# Patient Record
Sex: Female | Born: 1965 | Race: White | Hispanic: No | Marital: Married | Smoking: Former smoker
Health system: Southern US, Community
[De-identification: ages and names within clinical notes are randomized; demographics above are authoritative.]

## PROBLEM LIST (undated history)

## (undated) DIAGNOSIS — I10 Essential (primary) hypertension: Secondary | ICD-10-CM

## (undated) DIAGNOSIS — N871 Moderate cervical dysplasia: Secondary | ICD-10-CM

## (undated) HISTORY — DX: Moderate cervical dysplasia: N87.1

## (undated) HISTORY — PX: MOUTH SURGERY: SHX715

## (undated) HISTORY — DX: Essential (primary) hypertension: I10

---

## 1997-12-22 ENCOUNTER — Other Ambulatory Visit: Admission: RE | Admit: 1997-12-22 | Discharge: 1997-12-22 | Payer: Self-pay | Admitting: Obstetrics and Gynecology

## 1999-01-08 ENCOUNTER — Other Ambulatory Visit: Admission: RE | Admit: 1999-01-08 | Discharge: 1999-01-08 | Payer: Self-pay | Admitting: Obstetrics and Gynecology

## 1999-10-22 ENCOUNTER — Encounter: Payer: Self-pay | Admitting: Obstetrics and Gynecology

## 1999-10-22 ENCOUNTER — Inpatient Hospital Stay (HOSPITAL_COMMUNITY): Admission: AD | Admit: 1999-10-22 | Discharge: 1999-10-22 | Payer: Self-pay | Admitting: Obstetrics and Gynecology

## 1999-10-24 ENCOUNTER — Inpatient Hospital Stay (HOSPITAL_COMMUNITY): Admission: AD | Admit: 1999-10-24 | Discharge: 1999-10-26 | Payer: Self-pay | Admitting: Obstetrics and Gynecology

## 1999-10-28 ENCOUNTER — Encounter: Admission: RE | Admit: 1999-10-28 | Discharge: 2000-01-26 | Payer: Self-pay | Admitting: Obstetrics and Gynecology

## 1999-11-19 ENCOUNTER — Other Ambulatory Visit: Admission: RE | Admit: 1999-11-19 | Discharge: 1999-11-19 | Payer: Self-pay | Admitting: Obstetrics and Gynecology

## 2001-05-14 ENCOUNTER — Other Ambulatory Visit: Admission: RE | Admit: 2001-05-14 | Discharge: 2001-05-14 | Payer: Self-pay | Admitting: Obstetrics and Gynecology

## 2002-06-17 ENCOUNTER — Other Ambulatory Visit: Admission: RE | Admit: 2002-06-17 | Discharge: 2002-06-17 | Payer: Self-pay | Admitting: Obstetrics and Gynecology

## 2003-04-29 ENCOUNTER — Inpatient Hospital Stay (HOSPITAL_COMMUNITY): Admission: AD | Admit: 2003-04-29 | Discharge: 2003-05-01 | Payer: Self-pay | Admitting: Obstetrics and Gynecology

## 2003-06-10 ENCOUNTER — Other Ambulatory Visit: Admission: RE | Admit: 2003-06-10 | Discharge: 2003-06-10 | Payer: Self-pay | Admitting: Obstetrics and Gynecology

## 2004-06-28 ENCOUNTER — Other Ambulatory Visit: Admission: RE | Admit: 2004-06-28 | Discharge: 2004-06-28 | Payer: Self-pay | Admitting: Obstetrics and Gynecology

## 2004-10-09 ENCOUNTER — Encounter: Admission: RE | Admit: 2004-10-09 | Discharge: 2004-10-09 | Payer: Self-pay | Admitting: Obstetrics and Gynecology

## 2016-09-24 DIAGNOSIS — R8299 Other abnormal findings in urine: Secondary | ICD-10-CM | POA: Diagnosis not present

## 2016-09-24 DIAGNOSIS — I10 Essential (primary) hypertension: Secondary | ICD-10-CM | POA: Diagnosis not present

## 2016-09-24 DIAGNOSIS — Z0000 Encounter for general adult medical examination without abnormal findings: Secondary | ICD-10-CM | POA: Diagnosis not present

## 2016-09-24 DIAGNOSIS — R7301 Impaired fasting glucose: Secondary | ICD-10-CM | POA: Diagnosis not present

## 2016-09-27 DIAGNOSIS — I10 Essential (primary) hypertension: Secondary | ICD-10-CM | POA: Diagnosis not present

## 2016-09-27 DIAGNOSIS — Z0000 Encounter for general adult medical examination without abnormal findings: Secondary | ICD-10-CM | POA: Diagnosis not present

## 2016-09-27 DIAGNOSIS — E668 Other obesity: Secondary | ICD-10-CM | POA: Diagnosis not present

## 2016-09-27 DIAGNOSIS — Z1389 Encounter for screening for other disorder: Secondary | ICD-10-CM | POA: Diagnosis not present

## 2016-09-27 DIAGNOSIS — K760 Fatty (change of) liver, not elsewhere classified: Secondary | ICD-10-CM | POA: Diagnosis not present

## 2016-09-27 DIAGNOSIS — R7301 Impaired fasting glucose: Secondary | ICD-10-CM | POA: Diagnosis not present

## 2016-10-14 DIAGNOSIS — H6123 Impacted cerumen, bilateral: Secondary | ICD-10-CM | POA: Diagnosis not present

## 2017-09-25 DIAGNOSIS — R7301 Impaired fasting glucose: Secondary | ICD-10-CM | POA: Diagnosis not present

## 2017-09-25 DIAGNOSIS — I10 Essential (primary) hypertension: Secondary | ICD-10-CM | POA: Diagnosis not present

## 2017-09-25 DIAGNOSIS — R82998 Other abnormal findings in urine: Secondary | ICD-10-CM | POA: Diagnosis not present

## 2017-10-03 DIAGNOSIS — I10 Essential (primary) hypertension: Secondary | ICD-10-CM | POA: Diagnosis not present

## 2017-10-03 DIAGNOSIS — Z1389 Encounter for screening for other disorder: Secondary | ICD-10-CM | POA: Diagnosis not present

## 2017-10-03 DIAGNOSIS — R7301 Impaired fasting glucose: Secondary | ICD-10-CM | POA: Diagnosis not present

## 2017-10-03 DIAGNOSIS — K760 Fatty (change of) liver, not elsewhere classified: Secondary | ICD-10-CM | POA: Diagnosis not present

## 2017-10-03 DIAGNOSIS — Z0000 Encounter for general adult medical examination without abnormal findings: Secondary | ICD-10-CM | POA: Diagnosis not present

## 2017-10-03 DIAGNOSIS — E668 Other obesity: Secondary | ICD-10-CM | POA: Diagnosis not present

## 2017-10-10 DIAGNOSIS — L918 Other hypertrophic disorders of the skin: Secondary | ICD-10-CM | POA: Diagnosis not present

## 2017-10-10 DIAGNOSIS — D2261 Melanocytic nevi of right upper limb, including shoulder: Secondary | ICD-10-CM | POA: Diagnosis not present

## 2017-10-10 DIAGNOSIS — D2262 Melanocytic nevi of left upper limb, including shoulder: Secondary | ICD-10-CM | POA: Diagnosis not present

## 2017-10-10 DIAGNOSIS — D224 Melanocytic nevi of scalp and neck: Secondary | ICD-10-CM | POA: Diagnosis not present

## 2017-10-20 ENCOUNTER — Encounter: Payer: Self-pay | Admitting: Obstetrics and Gynecology

## 2017-10-20 ENCOUNTER — Other Ambulatory Visit (HOSPITAL_COMMUNITY)
Admission: RE | Admit: 2017-10-20 | Discharge: 2017-10-20 | Disposition: A | Discharge status: Home / Self Care | Payer: BLUE CROSS/BLUE SHIELD | Source: Ambulatory Visit | Attending: Obstetrics and Gynecology | Admitting: Obstetrics and Gynecology

## 2017-10-20 ENCOUNTER — Other Ambulatory Visit: Payer: Self-pay

## 2017-10-20 ENCOUNTER — Ambulatory Visit: Payer: BLUE CROSS/BLUE SHIELD | Admitting: Obstetrics and Gynecology

## 2017-10-20 VITALS — BP 128/80 | HR 80 | Resp 16 | Ht 64.0 in | Wt 194.0 lb

## 2017-10-20 DIAGNOSIS — Z01419 Encounter for gynecological examination (general) (routine) without abnormal findings: Secondary | ICD-10-CM

## 2017-10-20 DIAGNOSIS — Z1211 Encounter for screening for malignant neoplasm of colon: Secondary | ICD-10-CM

## 2017-10-20 DIAGNOSIS — N951 Menopausal and female climacteric states: Secondary | ICD-10-CM

## 2017-10-20 DIAGNOSIS — Z3009 Encounter for other general counseling and advice on contraception: Secondary | ICD-10-CM | POA: Diagnosis not present

## 2017-10-20 LAB — POCT URINE PREGNANCY: Preg Test, Ur: NEGATIVE

## 2017-10-20 NOTE — Progress Notes (Signed)
52 y.o. G45P0002 [name] here as a new patient for annual exam.  Patient has seen [facility] in the past. Referred from Mother who is a patient here. Per patient IUD due to come out.  Would like a new Mirena IUD.  Unsure if she has hot flashes.  Would like a new one.  Has painful experience in the past with the IUD exchange.  Works as Personnel officer.  Labs with PCP.  Did not complete her stool card kit with PCP.   PCP: Dr. Lang Snow     No LMP recorded. Patient is not currently having periods (Reason: IUD).    Not sure when it was due for removal.        Sexually active: Yes.    The current method of family planning is IUD -- Mirena IUD per patient due to come out.    Exercising: Yes.    walking Smoker:  former  Health Maintenance: Pap:  About 2 years ago per patient done at [facility] -- normal per patient History of abnormal Pap:  no MMG:  About 2 years ago at [facility] per patient normal. Colonoscopy:  never BMD:   N/a  Result  n/a TDaP:  Up to date within the last 10 years Gardasil:   no HIV: negative with pregnancy Hep C: never Screening Labs:  PCP   reports that she has quit smoking. Her smoking use included cigarettes. she has never used smokeless tobacco. She reports that she drinks alcohol. She reports that she does not use drugs.  Past Medical History:  Diagnosis Date  . Hypertension     History reviewed. No pertinent surgical history.  Current Outpatient Medications  Medication Sig Dispense Refill  . amLODipine (NORVASC) 5 MG tablet Take 5 mg by mouth daily.     No current facility-administered medications for this visit.     Family History  Problem Relation Age of Onset  . Hypertension Mother   . Hypertension Father   . Breast cancer Maternal Grandmother   . Lung cancer Maternal Grandfather   . Non-Hodgkin's lymphoma Paternal Grandmother   . Cancer Paternal Grandfather     ROS:  Pertinent  items are noted in HPI.  Otherwise, a comprehensive ROS was negative.  Exam:   BP 128/80 (BP Location: Right Arm, Patient Position: Sitting, Cuff Size: Large)   Pulse 80   Resp 16   Ht 5' 4"  (1.626 m)   Wt 194 lb (88 kg)   BMI 33.30 kg/m     General appearance: alert, cooperative and appears stated age Head: Normocephalic, without obvious abnormality, atraumatic Neck: no adenopathy, supple, symmetrical, trachea midline and thyroid normal to inspection and palpation Lungs: clear to auscultation bilaterally Breasts: normal appearance, no masses or tenderness, No nipple retraction or dimpling, No nipple discharge or bleeding, No axillary or supraclavicular adenopathy Heart: regular rate and rhythm Abdomen: soft, non-tender; no masses, no organomegaly Extremities: extremities normal, atraumatic, no cyanosis or edema Skin: Skin color, texture, turgor normal. No rashes or lesions Lymph nodes: Cervical, supraclavicular, and axillary nodes normal. No abnormal inguinal nodes palpated Neurologic: Grossly normal  Pelvic: External genitalia:  no lesions              Urethra:  normal appearing urethra with no masses, tenderness or lesions              Bartholins and Skenes: normal  Vagina: normal appearing vagina with normal color and discharge, no lesions              Cervix: no lesions.  IUD strings seen              Pap taken: Yes.   Bimanual Exam:  Uterus:  normal size, contour, position, consistency, mobility, non-tender              Adnexa: no mass, fullness, tenderness              Rectal exam: Yes.  .  Confirms.              Anus:  normal sphincter tone, no lesions  Chaperone was present for exam.  Assessment:   Well woman visit with normal exam. IUD expired.  Menopausal symptoms.  HTN.   Plan:  Mammogram screening discussed.  She will schedule at [facility]. Recommended self breast awareness. Pap and HR HPV as above. Guidelines for Calcium, Vitamin D,  regular exercise program including cardiovascular and weight bearing exercise. UPT.  Check FSH and estradiol. Routine labs with PCP. IFOB here. Condoms recommended for pregnancy prevention.  Follow up annually and prn.    After visit summary provided.

## 2017-10-20 NOTE — Addendum Note (Signed)
Addended by: Yisroel Ramming, Dietrich Pates E on: 10/20/2017 04:49 PM   Modules accepted: Orders

## 2017-10-20 NOTE — Addendum Note (Signed)
Addended by: Terence Lux A on: 10/20/2017 09:30 AM   Modules accepted: Orders

## 2017-10-20 NOTE — Patient Instructions (Signed)

## 2017-10-21 ENCOUNTER — Other Ambulatory Visit: Payer: Self-pay | Admitting: Obstetrics and Gynecology

## 2017-10-21 ENCOUNTER — Telehealth: Payer: Self-pay | Admitting: *Deleted

## 2017-10-21 DIAGNOSIS — Z3009 Encounter for other general counseling and advice on contraception: Secondary | ICD-10-CM

## 2017-10-21 LAB — ESTRADIOL: Estradiol: 130.1 pg/mL

## 2017-10-21 LAB — FOLLICLE STIMULATING HORMONE: FSH: 4.5 m[IU]/mL

## 2017-10-21 NOTE — Telephone Encounter (Signed)
Notes recorded by Burnice Logan, RN on 10/21/2017 at 2:49 PM EST Left message to call [name] at [phone].

## 2017-10-21 NOTE — Telephone Encounter (Signed)
-----   Message from Nunzio Cobbs, MD sent at 10/21/2017  2:37 PM EST ----- Please inform patient of labs which show she is not in menopause.  I will place an order for a new Mirena IUD.  She needs to use condoms for pregnancy prevention right now because her IUD is expired.   She had a painful IUD exchange in the past, and I told her I could make this more comfortable by doing a paracervical block.  The Cytotec is probably not going to give a lot of benefit, so I would skip this.

## 2017-10-21 NOTE — Telephone Encounter (Addendum)
Spoke with patient, advised as seen below per Dr. Quincy Simmonds. IUD exchange scheduled for 11/05/17 at 3:30pm. Has not been SA since last OV, aware to use condoms for pregnancy prevention. Patient is aware will be called with benefits, verbalizes understanding and is agreeable.    Routing to [employer] for benefits review.  Will close encounter.  Cc: Dr. Quincy Simmonds

## 2017-10-22 LAB — CYTOLOGY - PAP
DIAGNOSIS: NEGATIVE
HPV: NOT DETECTED

## 2017-11-05 ENCOUNTER — Other Ambulatory Visit: Payer: Self-pay

## 2017-11-05 ENCOUNTER — Encounter: Payer: Self-pay | Admitting: Obstetrics and Gynecology

## 2017-11-05 ENCOUNTER — Ambulatory Visit (INDEPENDENT_AMBULATORY_CARE_PROVIDER_SITE_OTHER): Payer: BLUE CROSS/BLUE SHIELD | Admitting: Obstetrics and Gynecology

## 2017-11-05 VITALS — BP 148/80 | HR 88 | Resp 16 | Wt 191.0 lb

## 2017-11-05 DIAGNOSIS — Z30433 Encounter for removal and reinsertion of intrauterine contraceptive device: Secondary | ICD-10-CM

## 2017-11-05 DIAGNOSIS — Z3009 Encounter for other general counseling and advice on contraception: Secondary | ICD-10-CM

## 2017-11-05 LAB — POCT URINE PREGNANCY: PREG TEST UR: NEGATIVE

## 2017-11-05 NOTE — Progress Notes (Signed)
GYNECOLOGY  VISIT   HPI: 52 y.o.   Married  Caucasian  female   G2P0002 with No LMP recorded (lmp unknown). Patient is not currently having periods (Reason: IUD).   here for Mirena IUD exchange  UPT: negative.  Last intercourse was 3 days ago.  Used a condom.  No unprotected intercourse in the last 2 weeks.   [place] 4.5 on 10/20/17.  GYNECOLOGIC HISTORY: No LMP recorded (lmp unknown). Patient is not currently having periods (Reason: IUD). Contraception:  IUD Mirena inserted about 2013  Menopausal hormone therapy:  none Last mammogram:  About 2 years ago at [facility] per patient normal Last pap smear:   10/20/17 Pap and HR HPV negative        OB History    Gravida Para Term Preterm AB Living   2 2 0 0 0 2   SAB TAB Ectopic Multiple Live Births   0 0 0 0 0         There are no active problems to display for this patient.   Past Medical History:  Diagnosis Date  . Hypertension     History reviewed. No pertinent surgical history.  Current Outpatient Medications  Medication Sig Dispense Refill  . amLODipine (NORVASC) 5 MG tablet Take 5 mg by mouth daily.     No current facility-administered medications for this visit.      ALLERGIES: Patient has no known allergies.  Family History  Problem Relation Age of Onset  . Hypertension Mother   . Hypertension Father   . Breast cancer Maternal Grandmother   . Lung cancer Maternal Grandfather   . Non-Hodgkin's lymphoma Paternal Grandmother   . Cancer Paternal Grandfather     Social History   Socioeconomic History  . Marital status: Married    Spouse name: Not on file  . Number of children: Not on file  . Years of education: Not on file  . Highest education level: Not on file  Social Needs  . Financial resource strain: Not on file  . Food insecurity - worry: Not on file  . Food insecurity - inability: Not on file  . Transportation needs - medical: Not on file  . Transportation needs - non-medical: Not on file   Occupational History  . Not on file  Tobacco Use  . Smoking status: Former Smoker    Types: Cigarettes  . Smokeless tobacco: Never Used  Substance and Sexual Activity  . Alcohol use: Yes    Comment: occasional  . Drug use: No  . Sexual activity: Yes    Birth control/protection: IUD    Comment: Mirena IUD  Other Topics Concern  . Not on file  Social History Narrative  . Not on file    ROS:  Pertinent items are noted in HPI.  PHYSICAL EXAMINATION:    BP (!) 148/80 (BP Location: Right Arm, Patient Position: Sitting, Cuff Size: Large)   Pulse 88   Resp 16   Wt 191 lb (86.6 kg)   LMP  (LMP Unknown)   BMI 32.79 kg/m     General appearance: alert, cooperative and appears stated age  Pelvic: External genitalia:  no lesions              Urethra:  normal appearing urethra with no masses, tenderness or lesions              Bartholins and Skenes: normal  Vagina: normal appearing vagina with normal color and discharge, no lesions              Cervix: no lesions.  IUD strings noted.                Bimanual Exam:  Uterus:  normal size, contour, position, consistency, mobility, non-tender              Adnexa: no mass, fullness, tenderness                IUD removal and reinsertion of Mirena IUD.  Lot number [id number], exp Aug 2021. Consent for procedures.  IUD removed with ring forceps, intact, and discarded (Patient declined to see.) Hibiclens prep.  Paracervical block with 10 cc 1% lidocaine, lot number [id number], exp 11/21. Uterine sounded to almost 6.5 cm.  IUD placed without difficulty.  Strings trimmed.  IUD card given to patient.  Minimal EBL. No complications.   Chaperone was present for exam.  ASSESSMENT  Mirena IUD removal and reinserting of new Mirena IUD.   PLAN  Instructions and precautions given.  Back up protection for 1 week.  Follow up in 1 month and prn.   An After Visit Summary was printed and given to the patient.

## 2017-11-05 NOTE — Patient Instructions (Signed)

## 2017-11-17 LAB — FECAL OCCULT BLOOD, IMMUNOCHEMICAL: IFOBT: NEGATIVE

## 2017-12-01 ENCOUNTER — Ambulatory Visit: Payer: BLUE CROSS/BLUE SHIELD | Admitting: Obstetrics and Gynecology

## 2017-12-01 ENCOUNTER — Other Ambulatory Visit: Payer: Self-pay

## 2017-12-01 ENCOUNTER — Encounter: Payer: Self-pay | Admitting: Obstetrics and Gynecology

## 2017-12-01 VITALS — BP 140/82 | HR 84 | Resp 16 | Ht 64.0 in | Wt 195.0 lb

## 2017-12-01 DIAGNOSIS — Z30431 Encounter for routine checking of intrauterine contraceptive device: Secondary | ICD-10-CM

## 2017-12-01 NOTE — Progress Notes (Signed)
GYNECOLOGY  VISIT   HPI: 52 y.o.   Married  Caucasian  female   G2P0002 with No LMP recorded (lmp unknown). (Menstrual status: IUD).   here for IUD recheck.  No bleeding other than the day of the insertion.  No dyspareunia. Has not checked the strings.   GYNECOLOGIC HISTORY: No LMP recorded (lmp unknown). (Menstrual status: IUD). Contraception:  11/05/17 Mirena IUD inserted Menopausal hormone therapy:  none Last mammogram:  About 2 years ago at [facility] per patient normal Last pap smear:   10/20/17 Pap and HR HPV negative        OB History    Gravida  2   Para  2   Term  0   Preterm  0   AB  0   Living  2     SAB  0   TAB  0   Ectopic  0   Multiple  0   Live Births  0              There are no active problems to display for this patient.   Past Medical History:  Diagnosis Date  . Hypertension     No past surgical history on file.  Current Outpatient Medications  Medication Sig Dispense Refill  . amLODipine (NORVASC) 5 MG tablet Take 5 mg by mouth daily.     No current facility-administered medications for this visit.      ALLERGIES: Patient has no known allergies.  Family History  Problem Relation Age of Onset  . Hypertension Mother   . Hypertension Father   . Breast cancer Maternal Grandmother   . Lung cancer Maternal Grandfather   . Non-Hodgkin's lymphoma Paternal Grandmother   . Cancer Paternal Grandfather     Social History   Socioeconomic History  . Marital status: Married    Spouse name: Not on file  . Number of children: Not on file  . Years of education: Not on file  . Highest education level: Not on file  Occupational History  . Not on file  Social Needs  . Financial resource strain: Not on file  . Food insecurity:    Worry: Not on file    Inability: Not on file  . Transportation needs:    Medical: Not on file    Non-medical: Not on file  Tobacco Use  . Smoking status: Former Smoker    Types: Cigarettes   . Smokeless tobacco: Never Used  Substance and Sexual Activity  . Alcohol use: Yes    Comment: occasional  . Drug use: No  . Sexual activity: Yes    Birth control/protection: IUD    Comment: Mirena IUD  Lifestyle  . Physical activity:    Days per week: Not on file    Minutes per session: Not on file  . Stress: Not on file  Relationships  . Social connections:    Talks on phone: Not on file    Gets together: Not on file    Attends religious service: Not on file    Active member of club or organization: Not on file    Attends meetings of clubs or organizations: Not on file    Relationship status: Not on file  . Intimate partner violence:    Fear of current or ex partner: Not on file    Emotionally abused: Not on file    Physically abused: Not on file    Forced sexual activity: Not on file  Other Topics Concern  .  Not on file  Social History Narrative  . Not on file    ROS:  Pertinent items are noted in HPI.  PHYSICAL EXAMINATION:    BP 140/82 (BP Location: Right Arm, Patient Position: Sitting, Cuff Size: Large)   Pulse 84   Resp 16   Ht 5\' 4"  (1.626 m)   Wt 195 lb (88.5 kg)   LMP  (LMP Unknown)   BMI 33.47 kg/m     General appearance: alert, cooperative and appears stated age   Pelvic: External genitalia:  no lesions              Urethra:  normal appearing urethra with no masses, tenderness or lesions              Bartholins and Skenes: normal                 Vagina: normal appearing vagina with normal color and discharge, no lesions              Cervix: no lesions.  IUD strings noted.                 Bimanual Exam:  Uterus:  normal size, contour, position, consistency, mobility, non-tender              Adnexa: no mass, fullness, tenderness        Chaperone was present for exam.  ASSESSMENT  Mirena IUD check up.  Doing well.   PLAN  IUD working well for contraception.  Fu for annual exam and prn.   An After Visit Summary was printed and given to the  patient.  __10____ minutes face to face time of which over 50% was spent in counseling.

## 2018-02-05 DIAGNOSIS — I10 Essential (primary) hypertension: Secondary | ICD-10-CM | POA: Diagnosis not present

## 2018-02-05 DIAGNOSIS — Z6833 Body mass index (BMI) 33.0-33.9, adult: Secondary | ICD-10-CM | POA: Diagnosis not present

## 2018-03-30 ENCOUNTER — Other Ambulatory Visit: Payer: Self-pay | Admitting: Obstetrics and Gynecology

## 2018-03-30 DIAGNOSIS — Z1231 Encounter for screening mammogram for malignant neoplasm of breast: Secondary | ICD-10-CM

## 2018-04-29 ENCOUNTER — Ambulatory Visit
Admission: RE | Admit: 2018-04-29 | Discharge: 2018-04-29 | Disposition: A | Discharge status: Home / Self Care | Payer: BLUE CROSS/BLUE SHIELD | Source: Ambulatory Visit | Attending: Obstetrics and Gynecology | Admitting: Obstetrics and Gynecology

## 2018-04-29 DIAGNOSIS — Z1231 Encounter for screening mammogram for malignant neoplasm of breast: Secondary | ICD-10-CM | POA: Diagnosis not present

## 2018-10-06 DIAGNOSIS — I10 Essential (primary) hypertension: Secondary | ICD-10-CM | POA: Diagnosis not present

## 2018-10-06 DIAGNOSIS — N390 Urinary tract infection, site not specified: Secondary | ICD-10-CM | POA: Diagnosis not present

## 2018-10-06 DIAGNOSIS — R7301 Impaired fasting glucose: Secondary | ICD-10-CM | POA: Diagnosis not present

## 2018-10-13 DIAGNOSIS — Z1212 Encounter for screening for malignant neoplasm of rectum: Secondary | ICD-10-CM | POA: Diagnosis not present

## 2018-10-13 LAB — IFOBT (OCCULT BLOOD): IFOBT: NEGATIVE

## 2018-10-14 DIAGNOSIS — R740 Nonspecific elevation of levels of transaminase and lactic acid dehydrogenase [LDH]: Secondary | ICD-10-CM | POA: Diagnosis not present

## 2018-10-14 DIAGNOSIS — Z0000 Encounter for general adult medical examination without abnormal findings: Secondary | ICD-10-CM | POA: Diagnosis not present

## 2018-10-14 DIAGNOSIS — Z1331 Encounter for screening for depression: Secondary | ICD-10-CM | POA: Diagnosis not present

## 2018-10-14 DIAGNOSIS — K760 Fatty (change of) liver, not elsewhere classified: Secondary | ICD-10-CM | POA: Diagnosis not present

## 2018-10-14 DIAGNOSIS — I10 Essential (primary) hypertension: Secondary | ICD-10-CM | POA: Diagnosis not present

## 2018-10-14 DIAGNOSIS — R7301 Impaired fasting glucose: Secondary | ICD-10-CM | POA: Diagnosis not present

## 2018-11-03 ENCOUNTER — Telehealth: Payer: Self-pay | Admitting: Obstetrics and Gynecology

## 2018-11-03 NOTE — Telephone Encounter (Signed)
Left patient a message to call back to reschedule a future appointment that was cancelled by the provider for her AEX. °

## 2018-11-23 ENCOUNTER — Ambulatory Visit: Payer: BLUE CROSS/BLUE SHIELD | Admitting: Obstetrics and Gynecology

## 2019-01-29 ENCOUNTER — Encounter: Payer: Self-pay | Admitting: Gastroenterology

## 2019-02-22 ENCOUNTER — Other Ambulatory Visit: Payer: Self-pay

## 2019-02-22 ENCOUNTER — Ambulatory Visit: Payer: BC Managed Care – PPO | Admitting: *Deleted

## 2019-02-22 VITALS — Ht 65.0 in | Wt 184.0 lb

## 2019-02-22 DIAGNOSIS — Z1211 Encounter for screening for malignant neoplasm of colon: Secondary | ICD-10-CM

## 2019-02-22 MED ORDER — SUPREP BOWEL PREP KIT 17.5-3.13-1.6 GM/177ML PO SOLN: 1.0000 | Freq: Once | ORAL | 0 refills | Status: AC

## 2019-02-22 NOTE — Progress Notes (Signed)
No egg or soy allergy known to patient  No issues with past sedation with any surgeries  or procedures, no intubation problems  No diet pills per patient No home 02 use per patient  No blood thinners per patient  Pt denies issues with constipation  No A fib or A flutter  EMMI video sent to pt's e mail   Pt verified name, DOB, address and insurance during PV today. Pt mailed instruction packet to included paper to complete and mail back to [facility] with addressed and stamped envelope, Emmi video, copy of consent form to read and not return, and instructions. Suprep $50  coupon mailed in packet. PV completed over the phone. Pt encouraged to call with questions or issues   Pt is aware that care partner will wait in the car during proceudre; if they feel like they will be too hot to wait in the car; they may wait in the lobby.  We want them to wear a mask (we do not have any that we can provide them), practice social distancing, and we will check their temperatures when they get here.  I did remind patient that their care partner needs to stay in the parking lot the entire time. Pt will wear mask into building.

## 2019-02-24 ENCOUNTER — Encounter: Payer: Self-pay | Admitting: Gastroenterology

## 2019-03-05 ENCOUNTER — Telehealth: Payer: Self-pay | Admitting: Gastroenterology

## 2019-03-05 NOTE — Telephone Encounter (Signed)
Patient called back and answered no to all questions.

## 2019-03-05 NOTE — Telephone Encounter (Signed)

## 2019-03-08 ENCOUNTER — Encounter: Payer: Self-pay | Admitting: Gastroenterology

## 2019-03-08 ENCOUNTER — Ambulatory Visit (AMBULATORY_SURGERY_CENTER): Payer: BC Managed Care – PPO | Admitting: Gastroenterology

## 2019-03-08 VITALS — BP 113/59 | HR 72 | Temp 98.9°F | Resp 15 | Ht 64.0 in | Wt 195.0 lb

## 2019-03-08 DIAGNOSIS — Z1211 Encounter for screening for malignant neoplasm of colon: Secondary | ICD-10-CM | POA: Diagnosis not present

## 2019-03-08 DIAGNOSIS — D123 Benign neoplasm of transverse colon: Secondary | ICD-10-CM

## 2019-03-08 DIAGNOSIS — K635 Polyp of colon: Secondary | ICD-10-CM | POA: Diagnosis not present

## 2019-03-08 DIAGNOSIS — D122 Benign neoplasm of ascending colon: Secondary | ICD-10-CM | POA: Diagnosis not present

## 2019-03-08 HISTORY — PX: COLONOSCOPY: SHX174

## 2019-03-08 MED ORDER — SODIUM CHLORIDE 0.9 % IV SOLN: 500.0000 mL | Freq: Once | INTRAVENOUS | Status: DC

## 2019-03-08 NOTE — Progress Notes (Signed)
To PACU, VSS. Report to [email or web address] 

## 2019-03-08 NOTE — Op Note (Signed)
[facility] Patient Name: Lacey Baker Procedure Date: 03/08/2019 7:33 AM MRN: [id number] Endoscopist: Thornton Park MD, MD Age: 53 Referring MD:  Date of Birth: Jun 14, 1966 Gender: Female Account #: [id number] Procedure:                Colonoscopy Indications:              Screening for colorectal malignant neoplasm, This                            is the patient's first colonoscopy                           No known family history of colon cancer or polyps                           No baseline GI symptoms Medicines:                See the Anesthesia note for documentation of the                            administered medications Procedure:                Pre-Anesthesia Assessment:                           - Prior to the procedure, a History and Physical                            was performed, and patient medications and                            allergies were reviewed. The patient's tolerance of                            previous anesthesia was also reviewed. The risks                            and benefits of the procedure and the sedation                            options and risks were discussed with the patient.                            All questions were answered, and informed consent                            was obtained. Prior Anticoagulants: The patient has                            taken no previous anticoagulant or antiplatelet                            agents. ASA Grade Assessment: II - A patient with  mild systemic disease. After reviewing the risks                            and benefits, the patient was deemed in                            satisfactory condition to undergo the procedure.                           After obtaining informed consent, the colonoscope                            was passed under direct vision. Throughout the                            procedure, the patient's blood pressure, pulse, and                            oxygen saturations were monitored continuously. The                            Colonoscope was introduced through the anus and                            advanced to the the terminal ileum, with                            identification of the appendiceal orifice and IC                            valve. The colonoscopy was performed with moderate                            difficulty due to restricted mobility of the colon,                            a redundant colon and a tortuous colon. Successful                            completion of the procedure was aided by applying                            abdominal pressure. The patient tolerated the                            procedure well. The quality of the bowel                            preparation was excellent. The terminal ileum,                            ileocecal valve, appendiceal orifice, and rectum  were photographed. Scope In: 7:39:17 AM Scope Out: 7:58:05 AM Scope Withdrawal Time: 0 hours 14 minutes 18 seconds  Total Procedure Duration: 0 hours 18 minutes 48 seconds  Findings:                 Hemorrhoids were found on perianal exam.                           An 55mm mm polyp was found in the ascending colon.                            The polyp was sessile. The polyp was removed with a                            cold snare. Resection and retrieval were complete.                            Estimated blood loss was minimal.                           A 10 mm polyp was found in the distal transverse                            colon. The polyp was sessile. The polyp was removed                            with a cold snare. Resection and retrieval were                            complete. Estimated blood loss was minimal.                           The exam was otherwise without abnormality on                            direct and retroflexion views. Complications:            No  immediate complications. Estimated blood loss:                            Minimal. Estimated Blood Loss:     Estimated blood loss was minimal. Impression:               - Hemorrhoids found on perianal exam.                           - One 8 mm polyp in the ascending colon, removed                            with a cold snare. Resected and retrieved.                           - One 10 mm polyp in the distal transverse colon,  removed with a cold snare. Resected and retrieved.                           - The examination was otherwise normal on direct                            and retroflexion views. Recommendation:           - Patient has a contact number available for                            emergencies. The signs and symptoms of potential                            delayed complications were discussed with the                            patient. Return to normal activities tomorrow.                            Written discharge instructions were provided to the                            patient.                           - Resume regular diet today.                           - Continue present medications.                           - Await pathology results.                           - Repeat colonoscopy date to be determined after                            pending pathology results are reviewed for                            surveillance based on pathology results and the                            size of the polyps. Thornton Park MD, MD 03/08/2019 8:04:33 AM This report has been signed electronically.

## 2019-03-08 NOTE — Progress Notes (Signed)
Pt's states no medical or surgical changes since previsit or office visit. MB temp JB vitals

## 2019-03-08 NOTE — Patient Instructions (Signed)
   Information on polyps and hemorrhoids given to you today  Await pathology results on polyps removed    YOU HAD AN ENDOSCOPIC PROCEDURE TODAY AT [facility]:   Refer to the procedure report that was given to you for any specific questions about what was found during the examination.  If the procedure report does not answer your questions, please call your gastroenterologist to clarify.  If you requested that your care partner not be given the details of your procedure findings, then the procedure report has been included in a sealed envelope for you to review at your convenience later.  YOU SHOULD EXPECT: Some feelings of bloating in the abdomen. Passage of more gas than usual.  Walking can help get rid of the air that was put into your GI tract during the procedure and reduce the bloating. If you had a lower endoscopy (such as a colonoscopy or flexible sigmoidoscopy) you may notice spotting of blood in your stool or on the toilet paper. If you underwent a bowel prep for your procedure, you may not have a normal bowel movement for a few days.  Please Note:  You might notice some irritation and congestion in your nose or some drainage.  This is from the oxygen used during your procedure.  There is no need for concern and it should clear up in a day or so.  SYMPTOMS TO REPORT IMMEDIATELY:   Following lower endoscopy (colonoscopy or flexible sigmoidoscopy):  Excessive amounts of blood in the stool  Significant tenderness or worsening of abdominal pains  Swelling of the abdomen that is new, acute  Fever of 100F or higher    For urgent or emergent issues, a gastroenterologist can be reached at any hour by calling [phone].   DIET:  We do recommend a small meal at first, but then you may proceed to your regular diet.  Drink plenty of fluids but you should avoid alcoholic beverages for 24 hours.  ACTIVITY:  You should plan to take it easy for the rest of today and you  should NOT DRIVE or use heavy machinery until tomorrow (because of the sedation medicines used during the test).    FOLLOW UP: Our staff will call the number listed on your records 48-72 hours following your procedure to check on you and address any questions or concerns that you may have regarding the information given to you following your procedure. If we do not reach you, we will leave a message.  We will attempt to reach you two times.  During this call, we will ask if you have developed any symptoms of COVID 19. If you develop any symptoms (ie: fever, flu-like symptoms, shortness of breath, cough etc.) before then, please call [phone].  If you test positive for Covid 19 in the 2 weeks post procedure, please call and report this information to [place].    If any biopsies were taken you will be contacted by phone or by letter within the next 1-3 weeks.  Please call us at [phone] if you have not heard about the biopsies in 3 weeks.    SIGNATURES/CONFIDENTIALITY: You and/or your care partner have signed paperwork which will be entered into your electronic medical record.  These signatures attest to the fact that that the information above on your After Visit Summary has been reviewed and is understood.  Full responsibility of the confidentiality of this discharge information lies with you and/or your care-partner.

## 2019-03-08 NOTE — Progress Notes (Signed)
Called to room to assist during endoscopic procedure.  Patient ID and intended procedure confirmed with present staff. Received instructions for my participation in the procedure from the performing physician.  

## 2019-03-10 ENCOUNTER — Telehealth: Payer: Self-pay

## 2019-03-10 NOTE — Telephone Encounter (Signed)
  Follow up Call-  Call back number 03/08/2019  Post procedure Call Back phone  # [phone]  Permission to leave phone message Yes  Some recent data might be hidden     Patient questions:  Do you have a fever, pain , or abdominal swelling? No. Pain Score  0 *  Have you tolerated food without any problems? Yes.    Have you been able to return to your normal activities? Yes.    Do you have any questions about your discharge instructions: Diet   No. Medications  No. Follow up visit  No.  Do you have questions or concerns about your Care? No.  Actions: * If pain score is 4 or above: No action needed, pain <4. 1. Have you developed a fever since your procedure? no  2.   Have you had an respiratory symptoms (SOB or cough) since your procedure? no  3.   Have you tested positive for COVID 19 since your procedure no  4.   Have you had any family members/close contacts diagnosed with the COVID 19 since your procedure?  no   If yes to any of these questions please route to Joylene John, RN and Alphonsa Gin, Therapist, sports.

## 2019-03-11 ENCOUNTER — Encounter: Payer: Self-pay | Admitting: Gastroenterology

## 2019-06-07 ENCOUNTER — Other Ambulatory Visit: Payer: Self-pay

## 2019-06-07 ENCOUNTER — Encounter: Payer: Self-pay | Admitting: Obstetrics and Gynecology

## 2019-06-07 ENCOUNTER — Ambulatory Visit: Payer: BC Managed Care – PPO | Admitting: Obstetrics and Gynecology

## 2019-06-07 VITALS — BP 138/80 | HR 76 | Temp 97.3°F | Resp 12 | Ht 64.25 in | Wt 188.0 lb

## 2019-06-07 DIAGNOSIS — Z23 Encounter for immunization: Secondary | ICD-10-CM

## 2019-06-07 DIAGNOSIS — Z01419 Encounter for gynecological examination (general) (routine) without abnormal findings: Secondary | ICD-10-CM

## 2019-06-07 NOTE — Patient Instructions (Signed)

## 2019-06-07 NOTE — Progress Notes (Signed)
53 y.o. G68P0002 [name] here for annual exam.    Vaginal bleeding is really sporadic.  No hot flashes.  Not taking her antiHTN Rx regularly.   PCP: Lacey Redwood, MD    No LMP recorded. (Menstrual status: IUD).           Sexually active: Yes.    The current method of family planning is Mirena IUD inserted 11/05/17.    Exercising: Yes.    walking and bike riding Smoker:  Former  Health Maintenance: Pap:  10/20/17 Pap and HR HPV negative History of abnormal Pap:  no MMG:  04/29/18 BIRADS 1 negative/density b Colonoscopy:  03/08/19 polyps removed; f/u 3 years.   BMD:   n/a  Result  n/a TDaP:  Unsure.  PCP following this.  Gardasil:   no HIV: never Hep C: never Screening Labs: PCP   reports that she has quit smoking. Her smoking use included cigarettes. She quit after 3.00 years of use. She has never used smokeless tobacco. She reports current alcohol use. She reports that she does not use drugs.  Past Medical History:  Diagnosis Date  . Hypertension     Past Surgical History:  Procedure Laterality Date  . COLONOSCOPY  03/08/2019  . MOUTH SURGERY      Current Outpatient Medications  Medication Sig Dispense Refill  . amLODipine (NORVASC) 5 MG tablet Take 5 mg by mouth daily.    . hydrochlorothiazide (HYDRODIURIL) 25 MG tablet TK 1 T PO D FOR BP    . ibuprofen (ADVIL) 200 MG tablet Take 200 mg by mouth every 6 (six) hours as needed for mild pain.    Marland Kitchen OVER THE COUNTER MEDICATION KETO & as directed    . OVER THE COUNTER MEDICATION Energy Life Super 8 as directed    . OVER THE COUNTER MEDICATION Relora 300 mg BID    . OVER THE COUNTER MEDICATION Silymarin 150 mg  As directed    . OVER THE COUNTER MEDICATION Probiotic- * billion Acidophyllis Veggie Capsule daily     No current facility-administered medications for this visit.     Family History  Problem Relation Age of Onset  . Hypertension Mother   . Hypertension Father   . Breast cancer Maternal  Grandmother        deceased at 27  . Lung cancer Maternal Grandfather   . Non-Hodgkin's lymphoma Paternal Grandmother   . Cancer Paternal Grandfather   . Colon cancer Neg Hx   . Colon polyps Neg Hx   . Esophageal cancer Neg Hx   . Rectal cancer Neg Hx   . Stomach cancer Neg Hx     Review of Systems  Constitutional: Negative.   HENT: Negative.   Eyes: Negative.   Respiratory: Negative.   Cardiovascular: Negative.   Gastrointestinal: Negative.   Endocrine: Negative.   Genitourinary: Negative.   Musculoskeletal: Negative.   Skin: Negative.   Allergic/Immunologic: Negative.   Neurological: Negative.   Hematological: Negative.   Psychiatric/Behavioral: Negative.     Exam:   BP 138/80 (BP Location: Left Arm, Patient Position: Sitting, Cuff Size: Large)   Pulse 76   Temp (!) 97.3 F (36.3 C) (Temporal)   Resp 12   Ht 5' 4.25" (1.632 m)   Wt 188 lb (85.3 kg)   BMI 32.02 kg/m     General appearance: alert, cooperative and appears stated age Head: normocephalic, without obvious abnormality, atraumatic Neck: no adenopathy, supple, symmetrical, trachea midline and thyroid normal to inspection  and palpation Lungs: clear to auscultation bilaterally Breasts: normal appearance, no masses or tenderness, No nipple retraction or dimpling, No nipple discharge or bleeding, No axillary adenopathy Heart: regular rate and rhythm Abdomen: soft, non-tender; no masses, no organomegaly Extremities: extremities normal, atraumatic, no cyanosis or edema Skin: skin color, texture, turgor normal. No rashes or lesions Lymph nodes: cervical, supraclavicular, and axillary nodes normal. Neurologic: grossly normal  Pelvic: External genitalia:  no lesions              No abnormal inguinal nodes palpated.              Urethra:  normal appearing urethra with no masses, tenderness or lesions              Bartholins and Skenes: normal                 Vagina: normal appearing vagina with normal color and  discharge, no lesions              Cervix: no lesions.  IUD strings noted.              Pap taken: No. Bimanual Exam:  Uterus:  normal size, contour, position, consistency, mobility, non-tender              Adnexa: no mass, fullness, tenderness              Rectal exam: Yes.  .  Confirms.              Anus:  normal sphincter tone, no lesions  Chaperone was present for exam.  Assessment:   Well woman visit with normal exam. Mirena IUD.  HTN.   Plan: Mammogram screening discussed.  She will schedule. Self breast awareness reviewed. Pap and HR HPV as above. Guidelines for Calcium, Vitamin D, regular exercise program including cardiovascular and weight bearing exercise. Flu vaccine today. Follow up annually and prn.   After visit summary provided.

## 2019-07-27 ENCOUNTER — Ambulatory Visit
Admission: RE | Admit: 2019-07-27 | Discharge: 2019-07-27 | Disposition: A | Discharge status: Home / Self Care | Payer: BC Managed Care – PPO | Source: Ambulatory Visit | Attending: Obstetrics and Gynecology | Admitting: Obstetrics and Gynecology

## 2019-07-27 ENCOUNTER — Other Ambulatory Visit: Payer: Self-pay

## 2019-07-27 ENCOUNTER — Other Ambulatory Visit: Payer: Self-pay | Admitting: Obstetrics and Gynecology

## 2019-07-27 DIAGNOSIS — Z1231 Encounter for screening mammogram for malignant neoplasm of breast: Secondary | ICD-10-CM

## 2020-02-03 IMAGING — MG DIGITAL SCREENING BILAT W/ TOMO W/ CAD
8 series · 8 of 24 positions shown · non-contrast
Comparison: Previous exam(s).

CLINICAL DATA: Screening.

EXAM:
DIGITAL SCREENING BILATERAL MAMMOGRAM WITH TOMO AND CAD

[L CC synth-2D]
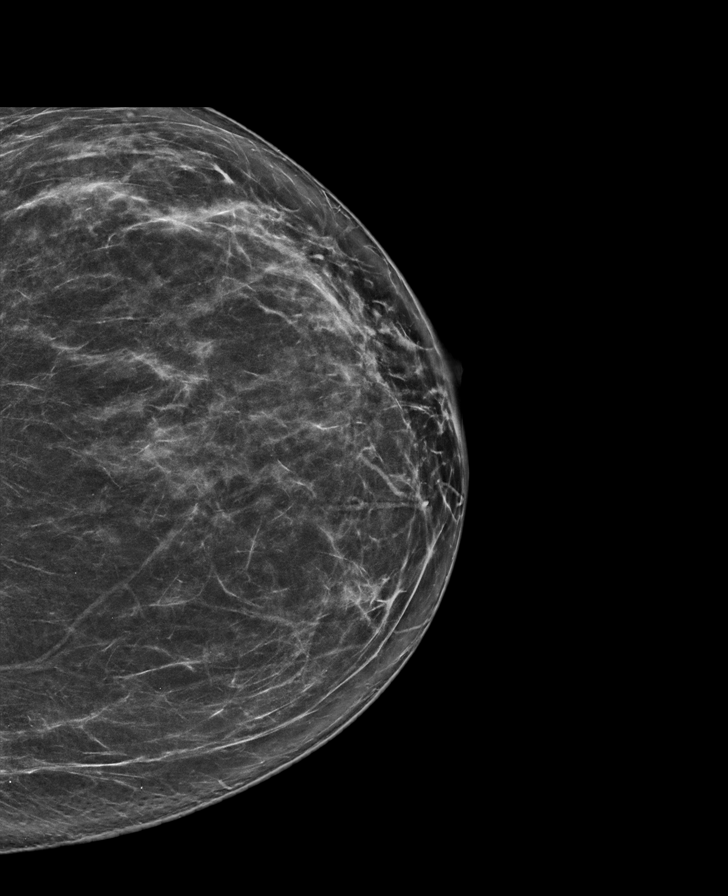

[R MLO synth-2D]
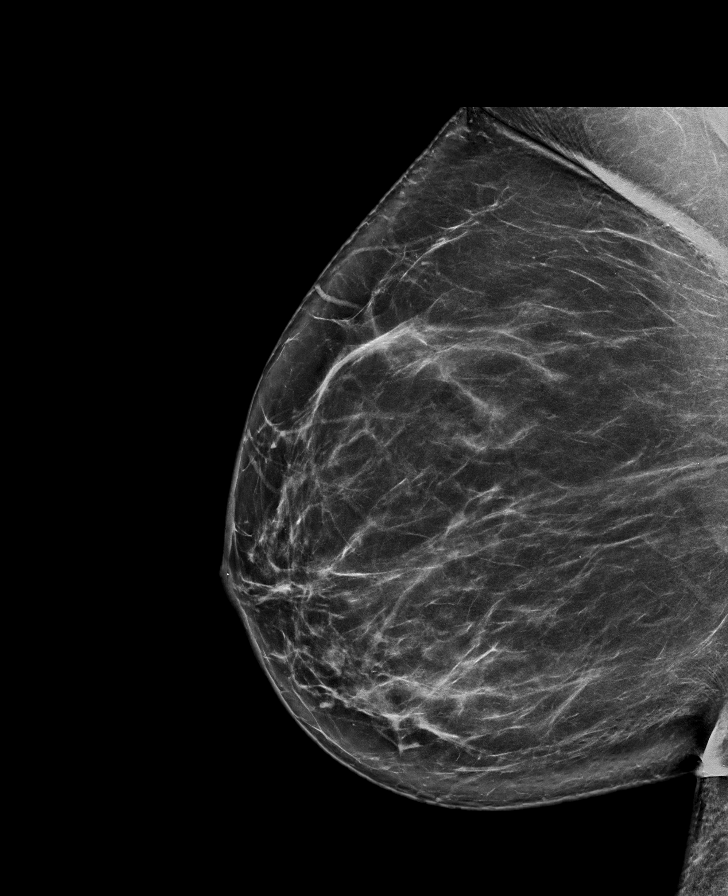

[L MLO synth-2D]
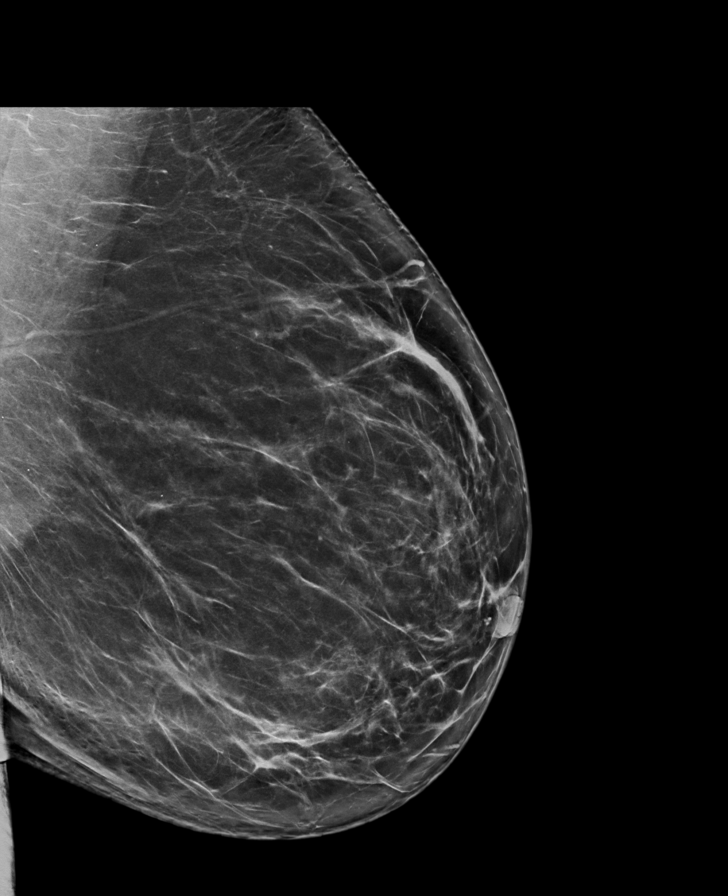

[R CC synth-2D]
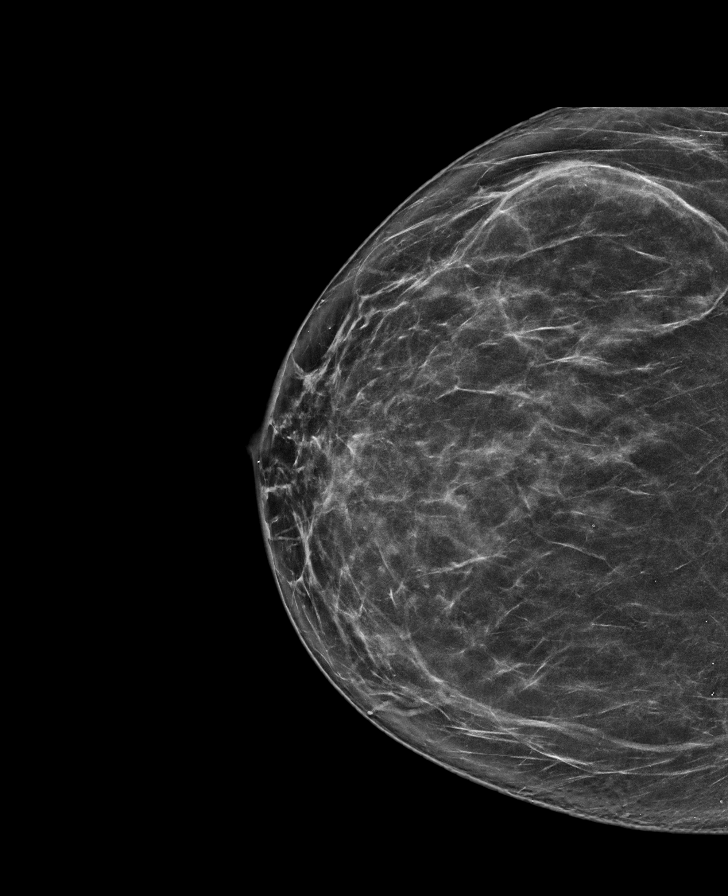

[R CC tomo · tomo slice 41/81.0]
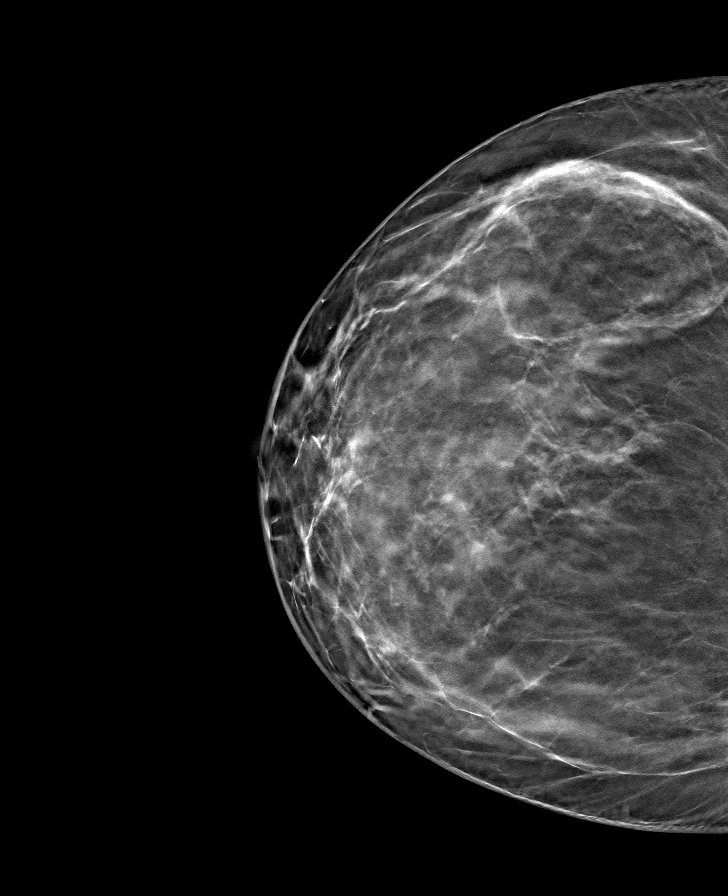

[L MLO tomo · tomo slice 47/94.0]
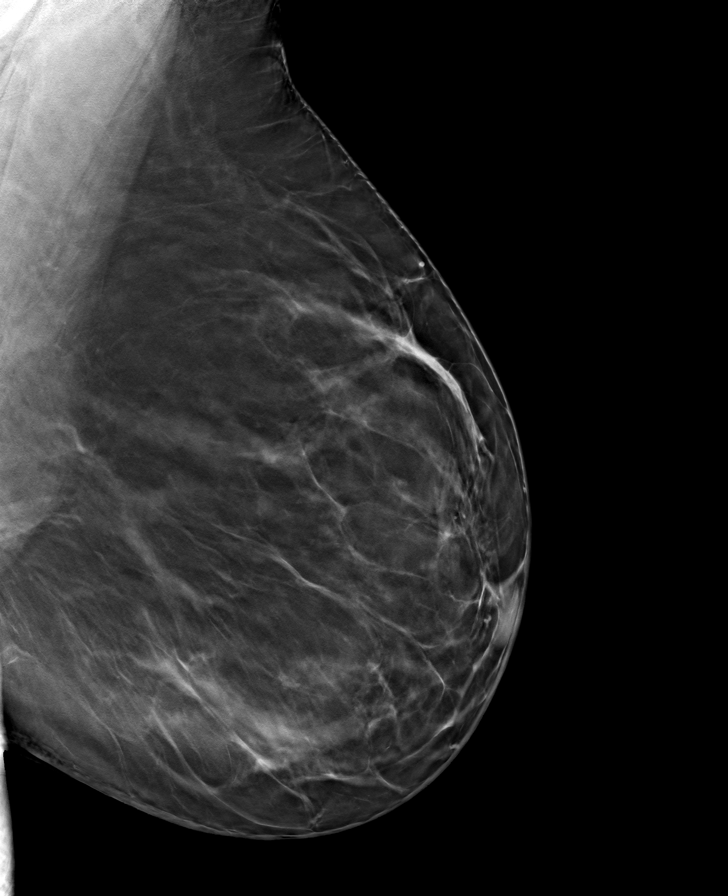

[L CC tomo · tomo slice 41/82.0]
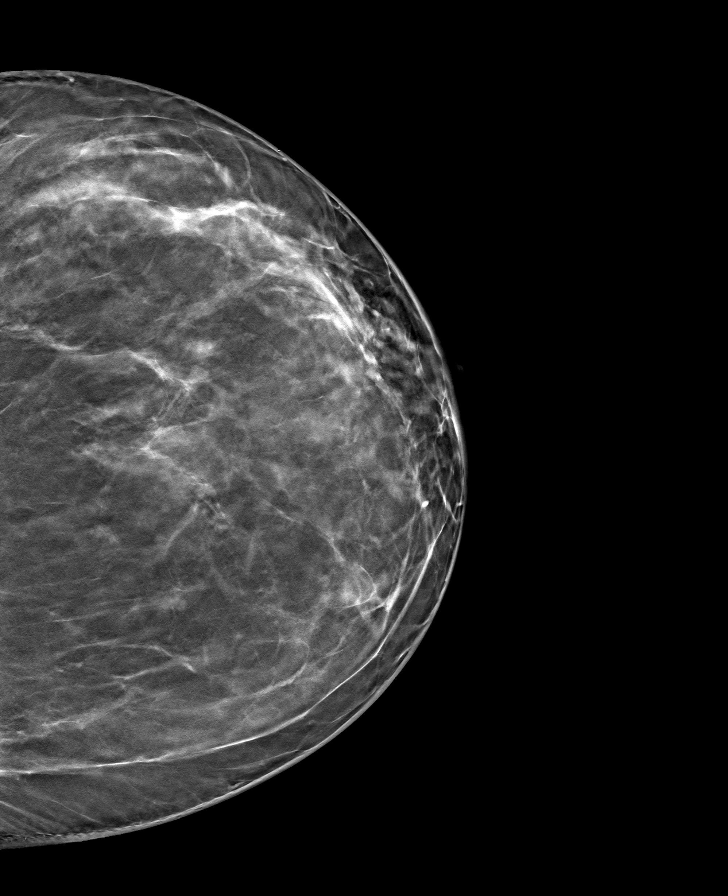

[R MLO tomo · tomo slice 46/91.0]
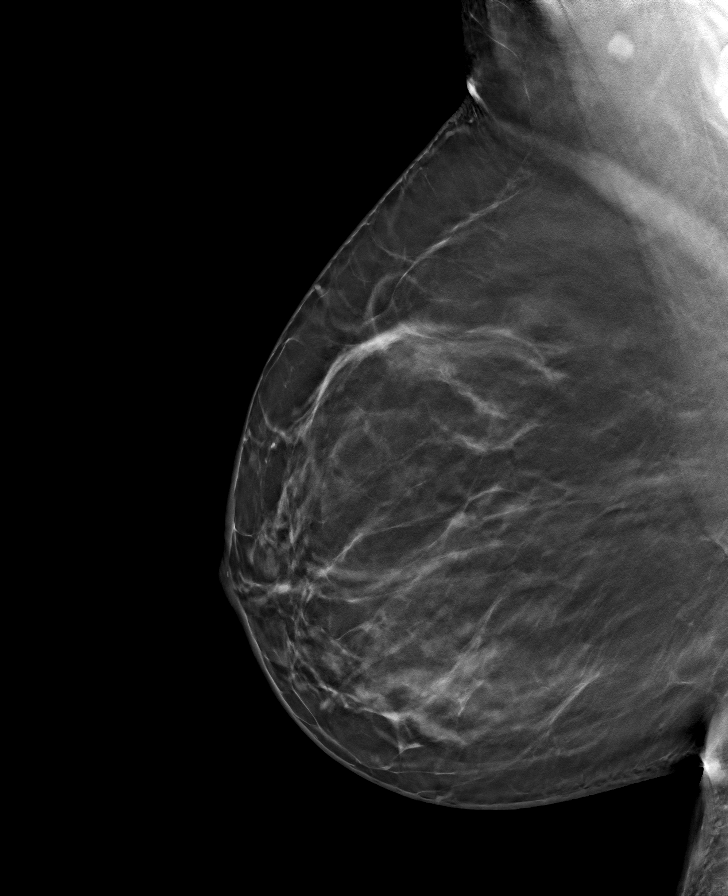

[8 of 24 positions shown; findings below may reference images not displayed]

ACR Breast Density Category b: There are scattered areas of
fibroglandular density.
FINDINGS: There are no findings suspicious for malignancy. Images were
processed with CAD.
IMPRESSION: No mammographic evidence of malignancy. A result letter of this
screening mammogram will be mailed directly to the patient.

RECOMMENDATION:
Screening mammogram in one year. (Code:[id number])

BI-RADS CATEGORY  1: Negative.

## 2022-04-01 ENCOUNTER — Encounter: Payer: Self-pay | Admitting: Gastroenterology

## 2022-07-19 ENCOUNTER — Other Ambulatory Visit: Payer: Self-pay

## 2022-09-05 DIAGNOSIS — R7401 Elevation of levels of liver transaminase levels: Secondary | ICD-10-CM | POA: Diagnosis not present

## 2022-09-05 DIAGNOSIS — K760 Fatty (change of) liver, not elsewhere classified: Secondary | ICD-10-CM | POA: Diagnosis not present

## 2022-09-05 DIAGNOSIS — E669 Obesity, unspecified: Secondary | ICD-10-CM | POA: Diagnosis not present

## 2022-09-05 DIAGNOSIS — Z23 Encounter for immunization: Secondary | ICD-10-CM | POA: Diagnosis not present

## 2022-09-05 DIAGNOSIS — R809 Proteinuria, unspecified: Secondary | ICD-10-CM | POA: Diagnosis not present

## 2022-09-05 DIAGNOSIS — I10 Essential (primary) hypertension: Secondary | ICD-10-CM | POA: Diagnosis not present

## 2022-09-05 DIAGNOSIS — R7301 Impaired fasting glucose: Secondary | ICD-10-CM | POA: Diagnosis not present

## 2022-09-16 DIAGNOSIS — D2262 Melanocytic nevi of left upper limb, including shoulder: Secondary | ICD-10-CM | POA: Diagnosis not present

## 2022-09-16 DIAGNOSIS — L821 Other seborrheic keratosis: Secondary | ICD-10-CM | POA: Diagnosis not present

## 2022-09-16 DIAGNOSIS — D225 Melanocytic nevi of trunk: Secondary | ICD-10-CM | POA: Diagnosis not present

## 2022-09-16 DIAGNOSIS — D2272 Melanocytic nevi of left lower limb, including hip: Secondary | ICD-10-CM | POA: Diagnosis not present

## 2022-09-16 DIAGNOSIS — D2261 Melanocytic nevi of right upper limb, including shoulder: Secondary | ICD-10-CM | POA: Diagnosis not present

## 2022-09-24 ENCOUNTER — Other Ambulatory Visit: Payer: Self-pay | Admitting: Obstetrics and Gynecology

## 2022-09-24 ENCOUNTER — Encounter: Payer: Self-pay | Admitting: Gastroenterology

## 2022-09-24 DIAGNOSIS — Z1231 Encounter for screening mammogram for malignant neoplasm of breast: Secondary | ICD-10-CM

## 2022-10-16 ENCOUNTER — Ambulatory Visit (AMBULATORY_SURGERY_CENTER): Payer: 59

## 2022-10-16 VITALS — Ht 64.0 in | Wt 178.0 lb

## 2022-10-16 DIAGNOSIS — Z86010 Personal history of colonic polyps: Secondary | ICD-10-CM

## 2022-10-16 MED ORDER — NA SULFATE-K SULFATE-MG SULF 17.5-3.13-1.6 GM/177ML PO SOLN: 1.0000 | Freq: Once | ORAL | 0 refills | Status: AC

## 2022-10-16 NOTE — Progress Notes (Signed)
Pre visit completed via phone call; Patient verified name, DOB, and address;  No egg or soy allergy known to patient;  No issues known to pt with past sedation with any surgeries or procedures; Patient denies ever being told they had issues or difficulty with intubation;  No FH of Malignant Hyperthermia; Pt is not on diet pills; Pt is not on home 02;  Pt is not on blood thinners;  Pt denies issues with constipation;  No A fib or A flutter; Have any cardiac testing pending--NO Pt instructed to use [email or web address] or GoodRx for a price reduction on prep;   Insurance verified during [place] appt=[employer]  Patient's chart reviewed by Osvaldo Angst CNRA prior to previsit and patient appropriate for the [facility].  Previsit completed and red dot placed by patient's name on their procedure day (on provider's schedule).    GoodRx coupon info sent with Rx to [facility] per patient request as patient reports [place] does not cover Suprep nor generic version-

## 2022-10-16 NOTE — Progress Notes (Signed)
57 y.o. G39P0002 [name] here for NEW GYN/annual exam.    Has Mirena IUD.  Rare spotting.  Maybe has hot flashes.  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  PCP:   Dr. Brigitte Pulse  No LMP recorded. (Menstrual status: IUD).           Sexually active: Yes.    The current method of family planning is IUD--Mirena 2020.    Exercising: Yes.     walking Smoker:  former  Health Maintenance: Pap: 09/20/20 -  normal, neg HR HPV, 10/20/17 neg: HR HPV neg History of abnormal Pap:  no MMG: scheduled next week, 08/09/21 Breast Density Category B, BI-RADS CATEGORY 1 Neg Colonoscopy:  03/08/19, scheduled next week BMD:   n/a  Result  n/a TDaP:  unsure Gardasil:   no Screening Labs:  PCP Shingrix:  did one vaccine.    reports that she has quit smoking. Her smoking use included cigarettes. She has never used smokeless tobacco. She reports current alcohol use. She reports that she does not use drugs.  Past Medical History:  Diagnosis Date   Hypertension    on meds    Past Surgical History:  Procedure Laterality Date   COLONOSCOPY  03/08/2019   KB-MAC-Suprep(exc)-polyps-59yr   MOUTH SURGERY      Current Outpatient Medications  Medication Sig Dispense Refill   amLODipine (NORVASC) 5 MG tablet Take 5 mg by mouth daily.     hydrochlorothiazide (HYDRODIURIL) 25 MG tablet TK 1 T PO D FOR BP     ibuprofen (ADVIL) 200 MG tablet Take 200 mg by mouth every 6 (six) hours as needed for mild pain.     Na Sulfate-K Sulfate-Mg Sulf 17.5-3.13-1.6 GM/177ML SOLN See admin instructions.     phentermine (ADIPEX-P) 37.5 MG tablet Take 37.5 mg by mouth daily before breakfast.     No current facility-administered medications for this visit.    Family History  Problem Relation Age of Onset   Hypertension Mother    Diverticulitis Mother    Diverticulosis Mother    Ulcerative colitis Father    Colon polyps Father    Hypertension Father    Breast cancer Maternal Grandmother        deceased at 514   Lung cancer Maternal Grandfather    Non-Hodgkin's lymphoma Paternal Grandmother    Cancer Paternal Grandfather    Colon cancer Neg Hx    Esophageal cancer Neg Hx    Rectal cancer Neg Hx    Stomach cancer Neg Hx     Review of Systems  All other systems reviewed and are negative.   Exam:   BP 134/86 (BP Location: Left Arm, Patient Position: Sitting, Cuff Size: Normal)   Pulse 71   Ht 5' 4.5" (1.638 m)   Wt 178 lb (80.7 kg)   SpO2 99%   BMI 30.08 kg/m     General appearance: alert, cooperative and appears stated age Head: normocephalic, without obvious abnormality, atraumatic Neck: no adenopathy, supple, symmetrical, trachea midline and thyroid normal to inspection and palpation Lungs: clear to auscultation bilaterally Breasts: normal appearance, no masses or tenderness, No nipple retraction or dimpling, No nipple discharge or bleeding, No axillary adenopathy Heart: regular rate and rhythm Abdomen: soft, non-tender; no masses, no organomegaly Extremities: extremities normal, atraumatic, no cyanosis or edema Skin: skin color, texture, turgor normal. No rashes or lesions Lymph nodes: cervical, supraclavicular, and axillary nodes normal. Neurologic: grossly normal  Pelvic: External genitalia:  no lesions  No abnormal inguinal nodes palpated.              Urethra:  normal appearing urethra with no masses, tenderness or lesions              Bartholins and Skenes: normal                 Vagina: normal appearing vagina with normal color and discharge.              Cervix:  White patch from 9 - 12:00.  Cervix is friable with pap.   IUD strings seen.              Pap taken: yes Bimanual Exam:  Uterus:  normal size, contour, position, consistency, mobility, non-tender              Adnexa: no mass, fullness, tenderness              Rectal exam: yes.  Confirms.              Anus:  normal sphincter tone, no lesions  Chaperone was present for exam:  Terra  Assessment:    Well woman visit with gynecologic exam. Cervical lesion noted. Mirena IUD.  HTN.  Plan: Mammogram screening discussed. Self breast awareness reviewed. Pap and HR HPV collected. Guidelines for Calcium, Vitamin D, regular exercise program including cardiovascular and weight bearing exercise. Follow up annually and prn.   After visit summary provided.

## 2022-10-17 ENCOUNTER — Encounter: Payer: Self-pay | Admitting: Gastroenterology

## 2022-10-30 ENCOUNTER — Other Ambulatory Visit (HOSPITAL_COMMUNITY)
Admission: RE | Admit: 2022-10-30 | Discharge: 2022-10-30 | Disposition: A | Discharge status: Home / Self Care | Payer: 59 | Source: Ambulatory Visit | Attending: Obstetrics and Gynecology | Admitting: Obstetrics and Gynecology

## 2022-10-30 ENCOUNTER — Ambulatory Visit: Payer: 59 | Admitting: Obstetrics and Gynecology

## 2022-10-30 ENCOUNTER — Encounter: Payer: Self-pay | Admitting: Obstetrics and Gynecology

## 2022-10-30 VITALS — BP 134/86 | HR 71 | Ht 64.5 in | Wt 178.0 lb

## 2022-10-30 DIAGNOSIS — Z124 Encounter for screening for malignant neoplasm of cervix: Secondary | ICD-10-CM | POA: Insufficient documentation

## 2022-10-30 DIAGNOSIS — N889 Noninflammatory disorder of cervix uteri, unspecified: Secondary | ICD-10-CM | POA: Diagnosis not present

## 2022-10-30 DIAGNOSIS — Z01419 Encounter for gynecological examination (general) (routine) without abnormal findings: Secondary | ICD-10-CM | POA: Diagnosis not present

## 2022-10-30 NOTE — Patient Instructions (Signed)

## 2022-10-30 NOTE — Addendum Note (Signed)
Addended by: Yisroel Ramming, Dietrich Pates E on: 10/30/2022 12:16 PM   Modules accepted: Orders

## 2022-11-05 LAB — CYTOLOGY - PAP
Comment: NEGATIVE
Diagnosis: UNDETERMINED — AB
High risk HPV: NEGATIVE

## 2022-11-06 ENCOUNTER — Ambulatory Visit (AMBULATORY_SURGERY_CENTER): Payer: 59 | Admitting: Gastroenterology

## 2022-11-06 ENCOUNTER — Encounter: Payer: Self-pay | Admitting: Gastroenterology

## 2022-11-06 VITALS — BP 126/74 | HR 68 | Temp 97.1°F | Resp 16 | Ht 64.0 in | Wt 178.0 lb

## 2022-11-06 DIAGNOSIS — D123 Benign neoplasm of transverse colon: Secondary | ICD-10-CM | POA: Diagnosis not present

## 2022-11-06 DIAGNOSIS — K635 Polyp of colon: Secondary | ICD-10-CM | POA: Diagnosis not present

## 2022-11-06 DIAGNOSIS — Z1211 Encounter for screening for malignant neoplasm of colon: Secondary | ICD-10-CM | POA: Diagnosis not present

## 2022-11-06 DIAGNOSIS — Z09 Encounter for follow-up examination after completed treatment for conditions other than malignant neoplasm: Secondary | ICD-10-CM

## 2022-11-06 DIAGNOSIS — I10 Essential (primary) hypertension: Secondary | ICD-10-CM | POA: Diagnosis not present

## 2022-11-06 DIAGNOSIS — Z86010 Personal history of colonic polyps: Secondary | ICD-10-CM

## 2022-11-06 MED ORDER — SODIUM CHLORIDE 0.9 % IV SOLN: 500.0000 mL | Freq: Once | INTRAVENOUS | Status: DC

## 2022-11-06 NOTE — Progress Notes (Signed)
Sedate, gd SR's, VSS, report to RN 

## 2022-11-06 NOTE — Progress Notes (Signed)
Called to room to assist during endoscopic procedure.  Patient ID and intended procedure confirmed with present staff. Received instructions for my participation in the procedure from the performing physician.  

## 2022-11-06 NOTE — Progress Notes (Signed)
VS completed by EC.   Pt's states no medical or surgical changes since previsit or office visit.  

## 2022-11-06 NOTE — Op Note (Addendum)
[facility] Patient Name: Lacey Baker Procedure Date: 11/06/2022 9:36 AM MRN: [id number] Endoscopist: Thornton Park MD, MD, [id number] Age: 57 Referring MD:  Date of Birth: 1966-08-04 Gender: Female Account #: [id number] Procedure:                Colonoscopy Indications:              High risk colon cancer surveillance: Personal                            history of sessile serrated colon polyp (10 mm or                            greater in size)                           Colonoscopy 03/08/19:                           - Hemorrhoids found on perianal exam.                           - One 8 mm polyp in the ascending colon, removed                            with a cold snare. Resected and retrieved.                           - One 10 mm polyp in the distal transverse colon,                            removed with a cold snare. Resected and retrieved.                           Both polyps were sessile serrated polyps Medicines:                Monitored Anesthesia Care Procedure:                Pre-Anesthesia Assessment:                           - Prior to the procedure, a History and Physical                            was performed, and patient medications and                            allergies were reviewed. The patient's tolerance of                            previous anesthesia was also reviewed. The risks                            and benefits of the procedure and the sedation  options and risks were discussed with the patient.                            All questions were answered, and informed consent                            was obtained. Prior Anticoagulants: The patient has                            taken no anticoagulant or antiplatelet agents. ASA                            Grade Assessment: II - A patient with mild systemic                            disease. After reviewing the risks and benefits,                             the patient was deemed in satisfactory condition to                            undergo the procedure.                           After obtaining informed consent, the colonoscope                            was passed under direct vision. Throughout the                            procedure, the patient's blood pressure, pulse, and                            oxygen saturations were monitored continuously. The                            Olympus CF-HQ190L SN [id number] was introduced through                            the anus and advanced to the 3 cm into the ileum. A                            second forward view of the right colon was                            performed. The colonoscopy was performed without                            difficulty. The patient tolerated the procedure                            well. The quality of the bowel preparation was  good. There was residual stool and food fiber in                            both the right colon and descending colon, but, I                            feel that I was able to clean around it and move                            the food particles to exclude any medium or large                            polyps. The terminal ileum, ileocecal valve,                            appendiceal orifice, and rectum were photographed. Scope In: 9:43:30 AM Scope Out: 10:08:43 AM Scope Withdrawal Time: 0 hours 21 minutes 40 seconds  Total Procedure Duration: 0 hours 25 minutes 13 seconds  Findings:                 Non-bleeding external and internal hemorrhoids were                            found.                           A 10 mm polyp was found in the splenic flexure. The                            polyp was flat. The polyp was removed with a                            piecemeal technique using a cold snare. Resection                            and retrieval were complete. Estimated blood loss                            was  minimal.                           The exam was otherwise without abnormality on                            direct and retroflexion views. Complications:            No immediate complications. Estimated Blood Loss:     Estimated blood loss was minimal. Impression:               - Non-bleeding external and internal hemorrhoids.                           - One 10 mm polyp at the splenic flexure, removed  piecemeal using a cold snare. Resected and                            retrieved.                           - The examination was otherwise normal on direct                            and retroflexion views. Recommendation:           - Patient has a contact number available for                            emergencies. The signs and symptoms of potential                            delayed complications were discussed with the                            patient. Return to normal activities tomorrow.                            Written discharge instructions were provided to the                            patient.                           - Resume previous diet.                           - Continue present medications.                           - Await pathology results.                           - Repeat colonoscopy in 3 years for surveillance if                            the polyp is a sessile serrated polyp. Otherwise,                            surveillance colonoscopy in 5 years. Two day bowel                            prep and pre-purgative antiemetics recommended in                            the future.                           - Emerging evidence supports eating a diet of                            fruits, vegetables,  grains, calcium, and yogurt                            while reducing red meat and alcohol may reduce the                            risk of colon cancer.                           - Thank you for allowing me to be involved in your                             colon cancer prevention. Thornton Park MD, MD 11/06/2022 10:13:51 AM This report has been signed electronically.

## 2022-11-06 NOTE — Progress Notes (Signed)
   Referring Provider: Ginger Organ., MD Primary Care Physician:  Ginger Organ., MD  Indication for Procedure:  Colon cancer Surveillance   IMPRESSION:  Need for colon cancer surveillance Appropriate candidate for monitored anesthesia care  PLAN: Colonoscopy in the [place] today   HPI: Lacey Baker is a 57 y.o. female presents for surveillance colonoscopy.  Prior endoscopic history: Colonoscopy 03/08/19: - Hemorrhoids found on perianal exam.  - One 8 mm polyp in the ascending colon, removed with a cold snare. Resected and retrieved.  - One 10 mm polyp in the distal transverse colon, removed with a cold snare. Resected and retrieved. Both polyps were sessile serrated polyps   Father with colon polyps. No other known family history of colon cancer or polyps. No family history of uterine/endometrial cancer, pancreatic cancer or gastric/stomach cancer.   Past Medical History:  Diagnosis Date   Hypertension    on meds    Past Surgical History:  Procedure Laterality Date   COLONOSCOPY  03/08/2019   KB-MAC-Suprep(exc)-polyps-76yrs   MOUTH SURGERY      Current Outpatient Medications  Medication Sig Dispense Refill   amLODipine (NORVASC) 5 MG tablet Take 5 mg by mouth daily.     hydrochlorothiazide (HYDRODIURIL) 25 MG tablet TK 1 T PO D FOR BP     ibuprofen (ADVIL) 200 MG tablet Take 200 mg by mouth every 6 (six) hours as needed for mild pain.     phentermine (ADIPEX-P) 37.5 MG tablet Take 37.5 mg by mouth daily before breakfast.     Current Facility-Administered Medications  Medication Dose Route Frequency Provider Last Rate Last Admin   0.9 %  sodium chloride infusion  500 mL Intravenous Once Thornton Park, MD        Allergies as of 11/06/2022   (No Known Allergies)    Family History  Problem Relation Age of Onset   Hypertension Mother    Diverticulitis Mother    Diverticulosis Mother    Ulcerative colitis Father    Colon polyps Father     Hypertension Father    Breast cancer Maternal Grandmother        deceased at 93   Lung cancer Maternal Grandfather    Non-Hodgkin's lymphoma Paternal Grandmother    Cancer Paternal Grandfather    Colon cancer Neg Hx    Esophageal cancer Neg Hx    Rectal cancer Neg Hx    Stomach cancer Neg Hx      Physical Exam: General:   Alert,  well-nourished, pleasant and cooperative in NAD Head:  Normocephalic and atraumatic. Eyes:  Sclera clear, no icterus.   Conjunctiva pink. Mouth:  No deformity or lesions.   Neck:  Supple; no masses or thyromegaly. Lungs:  Clear throughout to auscultation.   No wheezes. Heart:  Regular rate and rhythm; no murmurs. Abdomen:  Soft, non-tender, nondistended, normal bowel sounds, no rebound or guarding.  Msk:  Symmetrical. No boney deformities LAD: No inguinal or umbilical LAD Extremities:  No clubbing or edema. Neurologic:  Alert and  oriented x4;  grossly nonfocal Skin:  No obvious rash or bruise. Psych:  Alert and cooperative. Normal mood and affect.     Studies/Results: No results found.    Tea Collums L. Tarri Glenn, MD, MPH 11/06/2022, 9:27 AM

## 2022-11-06 NOTE — Patient Instructions (Addendum)
- Resume previous diet. - Continue present medications. - Await pathology results. - Repeat colonoscopy in 3 years for surveillance if the polyp is a sessile serrated polyp. Otherwise, surveillance colonoscopy in 5 years. Two day bowel prep recommended in the future. - Emerging evidence supports eating a diet of fruits, vegetables, grains, calcium, and yogurt while reducing red meat and alcohol may reduce the  risk of colon cancer. - Thank you for allowing me to be involved in your colon cancer prevention.  YOU HAD AN ENDOSCOPIC PROCEDURE TODAY AT [facility]:   Refer to the procedure report that was given to you for any specific questions about what was found during the examination.  If the procedure report does not answer your questions, please call your gastroenterologist to clarify.  If you requested that your care partner not be given the details of your procedure findings, then the procedure report has been included in a sealed envelope for you to review at your convenience later.  YOU SHOULD EXPECT: Some feelings of bloating in the abdomen. Passage of more gas than usual.  Walking can help get rid of the air that was put into your GI tract during the procedure and reduce the bloating. If you had a lower endoscopy (such as a colonoscopy or flexible sigmoidoscopy) you may notice spotting of blood in your stool or on the toilet paper. If you underwent a bowel prep for your procedure, you may not have a normal bowel movement for a few days.  Please Note:  You might notice some irritation and congestion in your nose or some drainage.  This is from the oxygen used during your procedure.  There is no need for concern and it should clear up in a day or so.  SYMPTOMS TO REPORT IMMEDIATELY:  Following lower endoscopy (colonoscopy or flexible sigmoidoscopy):  Excessive amounts of blood in the stool  Significant tenderness or worsening of abdominal pains  Swelling of the abdomen that  is new, acute  Fever of 100F or higher  For urgent or emergent issues, a gastroenterologist can be reached at any hour by calling [phone]. Do not use MyChart messaging for urgent concerns.    DIET:  We do recommend a small meal at first, but then you may proceed to your regular diet.  Drink plenty of fluids but you should avoid alcoholic beverages for 24 hours.  ACTIVITY:  You should plan to take it easy for the rest of today and you should NOT DRIVE or use heavy machinery until tomorrow (because of the sedation medicines used during the test).    FOLLOW UP: Our staff will call the number listed on your records the next business day following your procedure.  We will call around 7:15- 8:00 am to check on you and address any questions or concerns that you may have regarding the information given to you following your procedure. If we do not reach you, we will leave a message.     If any biopsies were taken you will be contacted by phone or by letter within the next 1-3 weeks.  Please call us at [phone] if you have not heard about the biopsies in 3 weeks.    SIGNATURES/CONFIDENTIALITY: You and/or your care partner have signed paperwork which will be entered into your electronic medical record.  These signatures attest to the fact that that the information above on your After Visit Summary has been reviewed and is understood.  Full responsibility of the confidentiality  of this discharge information lies with you and/or your care-partner.

## 2022-11-07 ENCOUNTER — Telehealth: Payer: Self-pay

## 2022-11-07 ENCOUNTER — Other Ambulatory Visit: Payer: Self-pay

## 2022-11-07 ENCOUNTER — Ambulatory Visit
Admission: RE | Admit: 2022-11-07 | Discharge: 2022-11-07 | Disposition: A | Discharge status: Home / Self Care | Payer: 59 | Source: Ambulatory Visit | Attending: Obstetrics and Gynecology | Admitting: Obstetrics and Gynecology

## 2022-11-07 DIAGNOSIS — Z1231 Encounter for screening mammogram for malignant neoplasm of breast: Secondary | ICD-10-CM | POA: Diagnosis not present

## 2022-11-07 DIAGNOSIS — N889 Noninflammatory disorder of cervix uteri, unspecified: Secondary | ICD-10-CM

## 2022-11-07 DIAGNOSIS — N879 Dysplasia of cervix uteri, unspecified: Secondary | ICD-10-CM

## 2022-11-07 NOTE — Telephone Encounter (Signed)
  Follow up Call-     11/06/2022    8:24 AM  Call back number  Post procedure Call Back phone  # [phone]  Permission to leave phone message Yes     Patient questions:  Do you have a fever, pain , or abdominal swelling? No. Pain Score  0 *  Have you tolerated food without any problems? Yes.    Have you been able to return to your normal activities? Yes.    Do you have any questions about your discharge instructions: Diet   No. Medications  No. Follow up visit  No.  Do you have questions or concerns about your Care? No.  Actions: * If pain score is 4 or above: No action needed, pain <4.

## 2022-11-18 ENCOUNTER — Encounter: Payer: Self-pay | Admitting: Gastroenterology

## 2022-12-03 NOTE — Progress Notes (Unsigned)
GYNECOLOGY  VISIT   HPI: 57 y.o.   Married  Caucasian  female   G2P0002 with No LMP recorded. (Menstrual status: IUD).   here for  colposcopy for white cervical lesion noted at annual exam.  Pap showed atypia and neg HR HPV on 10/30/22.   Has Mirena IUD.   Patient is traveling to the beach with girlfriends for the weekend.  UPT neg.  GYNECOLOGIC HISTORY: No LMP recorded. (Menstrual status: IUD). Contraception:  IUD--Mirena 2020 Menopausal hormone therapy:  n/a Last mammogram:  11/07/22 Breast Density Cat B, BI-RADS CAT 1 neg Last pap smear: 10/30/22 ASCUS: HR HPV neg,   09/20/20 -  normal, neg HR HPV, 10/20/17 neg: HR HPV neg         OB History     Gravida  2   Para  2   Term  0   Preterm  0   AB  0   Living  2      SAB  0   IAB  0   Ectopic  0   Multiple  0   Live Births  0              There are no problems to display for this patient.   Past Medical History:  Diagnosis Date   Hypertension    on meds    Past Surgical History:  Procedure Laterality Date   COLONOSCOPY  03/08/2019   KB-MAC-Suprep(exc)-polyps-52yrs   MOUTH SURGERY      Current Outpatient Medications  Medication Sig Dispense Refill   amLODipine (NORVASC) 5 MG tablet Take 5 mg by mouth daily.     hydrochlorothiazide (HYDRODIURIL) 25 MG tablet TK 1 T PO D FOR BP     ibuprofen (ADVIL) 200 MG tablet Take 200 mg by mouth every 6 (six) hours as needed for mild pain.     phentermine (ADIPEX-P) 37.5 MG tablet Take 37.5 mg by mouth daily before breakfast.     No current facility-administered medications for this visit.     ALLERGIES: Patient has no known allergies.  Family History  Problem Relation Age of Onset   Hypertension Mother    Diverticulitis Mother    Diverticulosis Mother    Ulcerative colitis Father    Colon polyps Father    Hypertension Father    Breast cancer Maternal Grandmother        deceased at 66   Lung cancer Maternal Grandfather    Non-Hodgkin's lymphoma  Paternal Grandmother    Cancer Paternal Grandfather    Colon cancer Neg Hx    Esophageal cancer Neg Hx    Rectal cancer Neg Hx    Stomach cancer Neg Hx     Social History   Socioeconomic History   Marital status: Married    Spouse name: Not on file   Number of children: Not on file   Years of education: Not on file   Highest education level: Not on file  Occupational History   Not on file  Tobacco Use   Smoking status: Former    Years: 3    Types: Cigarettes   Smokeless tobacco: Never  Vaping Use   Vaping Use: Never used  Substance and Sexual Activity   Alcohol use: Yes    Comment: special occasions   Drug use: No   Sexual activity: Yes    Birth control/protection: I.U.D.    Comment: Mirena IUD  Other Topics Concern   Not on file  Social History Narrative  Not on file   Social Determinants of Health   Financial Resource Strain: Not on file  Food Insecurity: Not on file  Transportation Needs: Not on file  Physical Activity: Not on file  Stress: Not on file  Social Connections: Not on file  Intimate Partner Violence: Not on file    Review of Systems  All other systems reviewed and are negative.   PHYSICAL EXAMINATION:    BP 134/88 (BP Location: Right Arm, Patient Position: Sitting, Cuff Size: Normal)   Pulse 89   Ht 5' 4.5" (1.638 m)   Wt 178 lb (80.7 kg)   SpO2 98%   BMI 30.08 kg/m     General appearance: alert, cooperative and appears stated age   Colposcopy - cervix, vagina. Consent for procedure.  Speculum placed.  White lesion on the cervix at 12:00, about 1 cm in diameter.  IUD strings noted.  3% acetic acid used in vagina and on cervix.  White light and green light filter used.  Colposcopy satisfactory:  Yes   _x____          No    _____ Findings:     Cervix:  1.5 cm patch of acetowhite change at 12:00.  Raised glandular areas at 6:00, nabothian cysts? Vagina:  no lesions.  Biopsies:  ECC, biopsy at 6:00, biopsy at 12:00.  IUD respected  and maintained.   Monsel's placed.  Minimal EBL. No complications.   Chaperone was present for exam:  Irving Burton, CMA  ASSESSMENT  ASCUS pap, negative HR HPV.  Cervical lesion.  Mirena IUD.  PLAN  Follow up biopsies.  Final plan to follow.  Post colposcopy instructions to patient.

## 2022-12-11 DIAGNOSIS — I10 Essential (primary) hypertension: Secondary | ICD-10-CM | POA: Diagnosis not present

## 2022-12-11 DIAGNOSIS — Z0000 Encounter for general adult medical examination without abnormal findings: Secondary | ICD-10-CM | POA: Diagnosis not present

## 2022-12-11 DIAGNOSIS — R7301 Impaired fasting glucose: Secondary | ICD-10-CM | POA: Diagnosis not present

## 2022-12-16 ENCOUNTER — Encounter: Payer: Self-pay | Admitting: Obstetrics and Gynecology

## 2022-12-16 ENCOUNTER — Ambulatory Visit: Payer: 59 | Admitting: Obstetrics and Gynecology

## 2022-12-16 ENCOUNTER — Other Ambulatory Visit (HOSPITAL_COMMUNITY)
Admission: RE | Admit: 2022-12-16 | Discharge: 2022-12-16 | Disposition: A | Discharge status: Home / Self Care | Payer: 59 | Source: Ambulatory Visit | Attending: Obstetrics and Gynecology | Admitting: Obstetrics and Gynecology

## 2022-12-16 VITALS — BP 134/88 | HR 89 | Ht 64.5 in | Wt 178.0 lb

## 2022-12-16 DIAGNOSIS — N889 Noninflammatory disorder of cervix uteri, unspecified: Secondary | ICD-10-CM

## 2022-12-16 DIAGNOSIS — N879 Dysplasia of cervix uteri, unspecified: Secondary | ICD-10-CM | POA: Insufficient documentation

## 2022-12-16 DIAGNOSIS — Z01812 Encounter for preprocedural laboratory examination: Secondary | ICD-10-CM

## 2022-12-16 LAB — PREGNANCY, URINE: Preg Test, Ur: NEGATIVE

## 2022-12-16 NOTE — Patient Instructions (Signed)
Colposcopy, Care After  The following information offers guidance on how to care for yourself after your procedure. Your health care provider may also give you more specific instructions. If you have problems or questions, contact your health care provider. What can I expect after the procedure? If you had a colposcopy without a biopsy, you can expect to feel fine right away after your procedure. However, you may have some spotting of blood for a few days. You can return to your normal activities. If you had a colposcopy with a biopsy, it is common after the procedure to have: Soreness and mild pain. These may last for a few days. Mild vaginal bleeding or discharge that is dark-colored and grainy. This may last for a few days. The discharge may be caused by a liquid (solution) that was used during the procedure. You may need to wear a sanitary pad during this time. Spotting of blood for at least 48 hours after the procedure. Follow these instructions at home: Medicines Take over-the-counter and prescription medicines only as told by your health care provider. Talk with your health care provider about what type of over-the-counter pain medicines and prescription medicines you can start to take again. It is especially important to talk with your health care provider if you take blood thinners. Activity Avoid using douche products, using tampons, and having sex for at least 3 days after the procedure or for as long as told by your health care provider. Return to your normal activities as told by your health care provider. Ask your health care provider what activities are safe for you. General instructions Ask your health care provider if you may take baths, swim, or use a hot tub. You may take showers. If you use birth control (contraception), continue to use it. Keep all follow-up visits. This is important. Contact a health care provider if: You have a fever or chills. You faint or feel  light-headed. Get help right away if: You have heavy bleeding from your vagina or pass blood clots. Heavy bleeding is bleeding that soaks through a sanitary pad in less than 1 hour. You have vaginal discharge that is abnormal, is yellow in color, or smells bad. This could be a sign of infection. You have severe pain or cramps in your lower abdomen that do not go away with medicine. Summary If you had a colposcopy without a biopsy, you can expect to feel fine right away, but you may have some spotting of blood for a few days. You can return to your normal activities. If you had a colposcopy with a biopsy, it is common to have mild pain for a few days and spotting for 48 hours after the procedure. Avoid using douche products, using tampons, and having sex for at least 3 days after the procedure or for as long as told by your health care provider. Get help right away if you have heavy bleeding, severe pain, or signs of infection. This information is not intended to replace advice given to you by your health care provider. Make sure you discuss any questions you have with your health care provider. Document Revised: 12/31/2020 Document Reviewed: 12/31/2020 Elsevier Patient Education  2023 [employer]vier Inc[employer]  

## 2022-12-18 DIAGNOSIS — R82998 Other abnormal findings in urine: Secondary | ICD-10-CM | POA: Diagnosis not present

## 2022-12-18 DIAGNOSIS — I10 Essential (primary) hypertension: Secondary | ICD-10-CM | POA: Diagnosis not present

## 2022-12-18 LAB — SURGICAL PATHOLOGY

## 2023-10-28 ENCOUNTER — Other Ambulatory Visit: Payer: Self-pay | Admitting: Obstetrics and Gynecology

## 2023-10-28 DIAGNOSIS — Z1231 Encounter for screening mammogram for malignant neoplasm of breast: Secondary | ICD-10-CM

## 2023-11-10 ENCOUNTER — Ambulatory Visit
Admission: RE | Admit: 2023-11-10 | Discharge: 2023-11-10 | Disposition: A | Discharge status: Home / Self Care | Source: Ambulatory Visit | Attending: Obstetrics and Gynecology | Admitting: Obstetrics and Gynecology

## 2023-11-10 DIAGNOSIS — Z1231 Encounter for screening mammogram for malignant neoplasm of breast: Secondary | ICD-10-CM | POA: Diagnosis not present

## 2023-11-13 ENCOUNTER — Other Ambulatory Visit: Payer: Self-pay | Admitting: Obstetrics and Gynecology

## 2023-11-13 DIAGNOSIS — R928 Other abnormal and inconclusive findings on diagnostic imaging of breast: Secondary | ICD-10-CM

## 2023-11-19 ENCOUNTER — Ambulatory Visit
Admission: RE | Admit: 2023-11-19 | Discharge: 2023-11-19 | Disposition: A | Discharge status: Home / Self Care | Source: Ambulatory Visit | Attending: Obstetrics and Gynecology | Admitting: Obstetrics and Gynecology

## 2023-11-19 DIAGNOSIS — N6001 Solitary cyst of right breast: Secondary | ICD-10-CM | POA: Diagnosis not present

## 2023-11-19 DIAGNOSIS — N6314 Unspecified lump in the right breast, lower inner quadrant: Secondary | ICD-10-CM | POA: Diagnosis not present

## 2023-11-19 DIAGNOSIS — R928 Other abnormal and inconclusive findings on diagnostic imaging of breast: Secondary | ICD-10-CM

## 2023-11-27 ENCOUNTER — Other Ambulatory Visit

## 2023-11-27 ENCOUNTER — Encounter

## 2023-12-01 DIAGNOSIS — D1801 Hemangioma of skin and subcutaneous tissue: Secondary | ICD-10-CM | POA: Diagnosis not present

## 2023-12-01 DIAGNOSIS — D225 Melanocytic nevi of trunk: Secondary | ICD-10-CM | POA: Diagnosis not present

## 2023-12-01 DIAGNOSIS — L821 Other seborrheic keratosis: Secondary | ICD-10-CM | POA: Diagnosis not present

## 2023-12-01 DIAGNOSIS — L814 Other melanin hyperpigmentation: Secondary | ICD-10-CM | POA: Diagnosis not present

## 2023-12-01 DIAGNOSIS — D2272 Melanocytic nevi of left lower limb, including hip: Secondary | ICD-10-CM | POA: Diagnosis not present

## 2023-12-01 DIAGNOSIS — D2262 Melanocytic nevi of left upper limb, including shoulder: Secondary | ICD-10-CM | POA: Diagnosis not present

## 2023-12-01 DIAGNOSIS — D485 Neoplasm of uncertain behavior of skin: Secondary | ICD-10-CM | POA: Diagnosis not present

## 2023-12-01 DIAGNOSIS — L820 Inflamed seborrheic keratosis: Secondary | ICD-10-CM | POA: Diagnosis not present

## 2023-12-01 DIAGNOSIS — D2271 Melanocytic nevi of right lower limb, including hip: Secondary | ICD-10-CM | POA: Diagnosis not present

## 2024-01-05 DIAGNOSIS — R82998 Other abnormal findings in urine: Secondary | ICD-10-CM | POA: Diagnosis not present

## 2024-03-03 ENCOUNTER — Encounter: Payer: Self-pay | Admitting: Obstetrics and Gynecology

## 2024-03-03 NOTE — Progress Notes (Unsigned)
 58 y.o. G78P0002 [name] here for annual exam.    PCP: Loreli Elsie JONETTA Mickey., MD   No LMP recorded. (Menstrual status: IUD).           Sexually active: Yes.    The current method of family planning is IUD.    Menopausal hormone therapy:  n/a Exercising: {yes no:314532}  {types:19826} Smoker:  Former   OB History  Gravida Para Term Preterm AB Living  2 2 0 0 0 2  SAB IAB Ectopic Multiple Live Births  0 0 0 0 0    # Outcome Date GA Lbr Len/2nd Weight Sex Type Anes PTL Lv  2 Para     F Vag-Spont     1 Para     M Vag-Spont        HEALTH MAINTENANCE: Last 2 paps:  10/30/22 ASCUS, HR HPV neg, 10/20/17 neg HPV neg History of abnormal Pap or positive HPV:  yes Mammogram:   11/19/23 R Breast - Breast Density Cat B, BIRADS Cat 2 benign  Colonoscopy:  11/06/22 Bone Density:  n/a  Result  n/a   Immunization History  Administered Date(s) Administered   Influenza,inj,Quad PF,6+ Mos 06/07/2019      reports that she has quit smoking. Her smoking use included cigarettes. She has never used smokeless tobacco. She reports current alcohol use. She reports that she does not use drugs.  Past Medical History:  Diagnosis Date   Hypertension    on meds    Past Surgical History:  Procedure Laterality Date   COLONOSCOPY  03/08/2019   KB-MAC-Suprep(exc)-polyps-8yrs   MOUTH SURGERY      Current Outpatient Medications  Medication Sig Dispense Refill   amLODipine (NORVASC) 5 MG tablet Take 5 mg by mouth daily.     hydrochlorothiazide (HYDRODIURIL) 25 MG tablet TK 1 T PO D FOR BP     ibuprofen (ADVIL) 200 MG tablet Take 200 mg by mouth every 6 (six) hours as needed for mild pain.     phentermine (ADIPEX-P) 37.5 MG tablet Take 37.5 mg by mouth daily before breakfast.     No current facility-administered medications for this visit.    ALLERGIES: Patient has no known allergies.  Family History  Problem Relation Age of Onset   Hypertension Mother    Diverticulitis Mother     Diverticulosis Mother    Ulcerative colitis Father    Colon polyps Father    Hypertension Father    Breast cancer Maternal Grandmother        deceased at 31   Lung cancer Maternal Grandfather    Non-Hodgkin's lymphoma Paternal Grandmother    Cancer Paternal Grandfather    Colon cancer Neg Hx    Esophageal cancer Neg Hx    Rectal cancer Neg Hx    Stomach cancer Neg Hx     Review of Systems  PHYSICAL EXAM:  There were no vitals taken for this visit.    General appearance: alert, cooperative and appears stated age Head: normocephalic, without obvious abnormality, atraumatic Neck: no adenopathy, supple, symmetrical, trachea midline and thyroid  normal to inspection and palpation Lungs: clear to auscultation bilaterally Breasts: normal appearance, no masses or tenderness, No nipple retraction or dimpling, No nipple discharge or bleeding, No axillary adenopathy Heart: regular rate and rhythm Abdomen: soft, non-tender; no masses, no organomegaly Extremities: extremities normal, atraumatic, no cyanosis or edema Skin: skin color, texture, turgor normal. No rashes or lesions Lymph nodes: cervical, supraclavicular, and axillary nodes normal. Neurologic:  grossly normal  Pelvic: External genitalia:  no lesions              No abnormal inguinal nodes palpated.              Urethra:  normal appearing urethra with no masses, tenderness or lesions              Bartholins and Skenes: normal                 Vagina: normal appearing vagina with normal color and discharge, no lesions              Cervix: no lesions              Pap taken: {yes no:314532} Bimanual Exam:  Uterus:  normal size, contour, position, consistency, mobility, non-tender              Adnexa: no mass, fullness, tenderness              Rectal exam: {yes no:314532}.  Confirms.              Anus:  normal sphincter tone, no lesions  Chaperone was present for exam:  {BSCHAPERONE:31226::Emily F, CMA}  ASSESSMENT: Well woman  visit with gynecologic exam.  PHQ-2-9: ***  ***  PLAN: Mammogram screening discussed. Self breast awareness reviewed. Pap and HRV collected:  {yes no:314532} Guidelines for Calcium, Vitamin D, regular exercise program including cardiovascular and weight bearing exercise. Medication refills:  *** {LABS (Optional):23779} Follow up:  ***    Additional counseling given.  {yes [id number]. ***  total time was spent for this patient encounter, including preparation, face-to-face counseling with the patient, coordination of care, and documentation of the encounter in addition to doing the well woman visit with gynecologic exam.

## 2024-03-04 ENCOUNTER — Encounter: Payer: Self-pay | Admitting: Obstetrics and Gynecology

## 2024-03-04 ENCOUNTER — Other Ambulatory Visit (HOSPITAL_COMMUNITY)
Admission: RE | Admit: 2024-03-04 | Discharge: 2024-03-04 | Disposition: A | Discharge status: Home / Self Care | Source: Ambulatory Visit | Attending: Obstetrics and Gynecology | Admitting: Obstetrics and Gynecology

## 2024-03-04 ENCOUNTER — Ambulatory Visit (INDEPENDENT_AMBULATORY_CARE_PROVIDER_SITE_OTHER): Admitting: Obstetrics and Gynecology

## 2024-03-04 VITALS — BP 126/84 | HR 81 | Ht 66.25 in | Wt 188.0 lb

## 2024-03-04 DIAGNOSIS — N889 Noninflammatory disorder of cervix uteri, unspecified: Secondary | ICD-10-CM | POA: Diagnosis not present

## 2024-03-04 DIAGNOSIS — Z124 Encounter for screening for malignant neoplasm of cervix: Secondary | ICD-10-CM

## 2024-03-04 DIAGNOSIS — Z01419 Encounter for gynecological examination (general) (routine) without abnormal findings: Secondary | ICD-10-CM

## 2024-03-04 DIAGNOSIS — N870 Mild cervical dysplasia: Secondary | ICD-10-CM | POA: Diagnosis not present

## 2024-03-04 DIAGNOSIS — N951 Menopausal and female climacteric states: Secondary | ICD-10-CM | POA: Diagnosis not present

## 2024-03-04 LAB — FOLLICLE STIMULATING HORMONE: FSH: 43.8 m[IU]/mL

## 2024-03-04 LAB — ESTRADIOL: Estradiol: 41 pg/mL

## 2024-03-04 MED ORDER — ALPRAZOLAM 0.25 MG PO TABS: ORAL_TABLET | ORAL | 0 refills | Status: AC

## 2024-03-04 NOTE — Patient Instructions (Signed)

## 2024-03-11 ENCOUNTER — Ambulatory Visit: Payer: Self-pay | Admitting: Nurse Practitioner

## 2024-03-11 LAB — CYTOLOGY - PAP
Comment: NEGATIVE
Diagnosis: NEGATIVE
High risk HPV: NEGATIVE

## 2024-04-16 NOTE — Progress Notes (Signed)
 GYNECOLOGY  VISIT   HPI: 58 y.o.   Married  Caucasian female   G2P0002 with No LMP recorded. (Menstrual status: IUD).   here for: Colposcopy.  She had a 7 mm raised white lesion of her cervix at 11:00.  Her pap was normal and her HR HPV negative.   Did not take Xanax  in preparation for the colposcopy today.   She has a Mirena  IUD.   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 (Menstrual status: IUD). Contraception:  Mirena  IUD- 11/05/17  Menopausal hormone therapy:  n/a Last 2 paps:  03/04/24 neg HR HPV neg, 10/30/22 ASCUS, HR HPV neg History of abnormal Pap or positive HPV:  yes Mammogram:  11/19/23 R Breast- Breast Density Cat B, BIRADS Cat 2 benign         OB History     Gravida  2   Para  2   Term  0   Preterm  0   AB  0   Living  2      SAB  0   IAB  0   Ectopic  0   Multiple  0   Live Births  0              There are no active problems to display for this patient.   Past Medical History:  Diagnosis Date   Hypertension    on meds    Past Surgical History:  Procedure Laterality Date   COLONOSCOPY  03/08/2019   KB-MAC-Suprep(exc)-polyps-60yrs   MOUTH SURGERY      Current Outpatient Medications  Medication Sig Dispense Refill   amLODipine (NORVASC) 5 MG tablet Take 5 mg by mouth daily.     hydrochlorothiazide (HYDRODIURIL) 25 MG tablet TK 1 T PO D FOR BP     ibuprofen (ADVIL) 200 MG tablet Take 200 mg by mouth every 6 (six) hours as needed for mild pain.     phentermine (ADIPEX-P) 37.5 MG tablet Take 37.5 mg by mouth daily before breakfast.     ALPRAZolam  (XANAX ) 0.25 MG tablet Take one tablet (0.25 mg) by mouth one hour prior to procedure. (Patient not taking: Reported on 04/26/2024) 2 tablet 0   No current facility-administered medications for this visit.     ALLERGIES: Patient has no known allergies.  Family History  Problem Relation Age of Onset   Hypertension Mother    Diverticulitis Mother     Diverticulosis Mother    Ulcerative colitis Father    Colon polyps Father    Hypertension Father    Breast cancer Maternal Grandmother        deceased at 5   Lung cancer Maternal Grandfather    Non-Hodgkin's lymphoma Paternal Grandmother    Cancer Paternal Grandfather    Colon cancer Neg Hx    Esophageal cancer Neg Hx    Rectal cancer Neg Hx    Stomach cancer Neg Hx     Social History   Socioeconomic History   Marital status: Married    Spouse name: Not on file   Number of children: Not on file   Years of education: Not on file   Highest education level: Not on file  Occupational History   Not on file  Tobacco Use   Smoking status: Former    Types: Cigarettes   Smokeless tobacco: Never  Vaping Use   Vaping status: Never Used  Substance and Sexual Activity   Alcohol use: Yes    Comment:  special occasions   Drug use: No   Sexual activity: Yes    Partners: Male    Birth control/protection: I.U.D.    Comment: Mirena  IUD- 11/05/17  Other Topics Concern   Not on file  Social History Narrative   Not on file   Social Drivers of Health   Financial Resource Strain: Not on file  Food Insecurity: Not on file  Transportation Needs: Not on file  Physical Activity: Not on file  Stress: Not on file  Social Connections: Not on file  Intimate Partner Violence: Not on file    Review of Systems  All other systems reviewed and are negative.   PHYSICAL EXAMINATION:   BP 130/86 (BP Location: Left Arm, Patient Position: Sitting)   Pulse 81   SpO2 99%     General appearance: alert, cooperative and appears stated age  Colposcopy - cervix, vagina. Consent for procedure.  Time out done.  Raised white lesion of the cervix at 11:00.  3% acetic acid used in vagina and on vulva. White light and green light filter used.  Colposcopy satisfactory:  Yes   ___x__          No    _____ Findings:    Cervix:  IUD strings noted.  Punctations at 5:00.  Raised white lesion of cervix at  11:00. Vagina:  no lesions. Biopsies:  ECC, biopsy at 5:00, biopsy at 11:00 Monsel's placed.  Minimal EBL. No complications.   Chaperone was present for exam:  Kari HERO, CMA  ASSESSMENT:  Cervical lesion at 11:00. 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   PLAN:  FU biopsies.  For normal, atypia, or LGSIL, will do pap and HR HPV in one year.  For HGSIL, LEEP.  Consider outpatient hospital procedure?

## 2024-04-26 ENCOUNTER — Encounter: Payer: Self-pay | Admitting: Obstetrics and Gynecology

## 2024-04-26 ENCOUNTER — Other Ambulatory Visit (HOSPITAL_COMMUNITY)
Admission: RE | Admit: 2024-04-26 | Discharge: 2024-04-26 | Disposition: A | Discharge status: Home / Self Care | Source: Ambulatory Visit | Attending: Obstetrics and Gynecology | Admitting: Obstetrics and Gynecology

## 2024-04-26 ENCOUNTER — Ambulatory Visit (INDEPENDENT_AMBULATORY_CARE_PROVIDER_SITE_OTHER): Admitting: Obstetrics and Gynecology

## 2024-04-26 VITALS — BP 130/86 | HR 81

## 2024-04-26 DIAGNOSIS — N72 Inflammatory disease of cervix uteri: Secondary | ICD-10-CM | POA: Diagnosis not present

## 2024-04-26 DIAGNOSIS — N889 Noninflammatory disorder of cervix uteri, unspecified: Secondary | ICD-10-CM

## 2024-04-26 DIAGNOSIS — Z01812 Encounter for preprocedural laboratory examination: Secondary | ICD-10-CM

## 2024-04-26 DIAGNOSIS — N871 Moderate cervical dysplasia: Secondary | ICD-10-CM | POA: Diagnosis not present

## 2024-04-26 DIAGNOSIS — Z87410 Personal history of cervical dysplasia: Secondary | ICD-10-CM | POA: Diagnosis not present

## 2024-04-26 LAB — PREGNANCY, URINE: Preg Test, Ur: NEGATIVE

## 2024-04-26 NOTE — Patient Instructions (Signed)
 Colposcopy, Care After  The following information offers guidance on how to care for yourself after your procedure. Your health care provider may also give you more specific instructions. If you have problems or questions, contact your health care provider. What can I expect after the procedure? If you had a colposcopy without a biopsy, you can expect to feel fine right away after your procedure. However, you may have some spotting of blood for a few days. You can return to your normal activities. If you had a colposcopy with a biopsy, it is common after the procedure to have: Soreness and mild pain. These may last for a few days. Mild vaginal bleeding or discharge that is dark-colored and grainy. This may last for a few days. The discharge may be caused by a liquid (solution) that was used during the procedure. You may need to wear a sanitary pad during this time. Spotting of blood for at least 48 hours after the procedure. Follow these instructions at home: Medicines Take over-the-counter and prescription medicines only as told by your health care provider. Talk with your health care provider about what type of over-the-counter pain medicines and prescription medicines you can start to take again. It is especially important to talk with your health care provider if you take blood thinners. Activity Avoid using douche products, using tampons, and having sex for at least 3 days after the procedure or for as long as told by your health care provider. Return to your normal activities as told by your health care provider. Ask your health care provider what activities are safe for you. General instructions Ask your health care provider if you may take baths, swim, or use a hot tub. You may take showers. If you use birth control (contraception), continue to use it. Keep all follow-up visits. This is important. Contact a health care provider if: You have a fever or chills. You faint or feel  light-headed. Get help right away if: You have heavy bleeding from your vagina or pass blood clots. Heavy bleeding is bleeding that soaks through a sanitary pad in less than 1 hour. You have vaginal discharge that is abnormal, is yellow in color, or smells bad. This could be a sign of infection. You have severe pain or cramps in your lower abdomen that do not go away with medicine. Summary If you had a colposcopy without a biopsy, you can expect to feel fine right away, but you may have some spotting of blood for a few days. You can return to your normal activities. If you had a colposcopy with a biopsy, it is common to have mild pain for a few days and spotting for 48 hours after the procedure. Avoid using douche products, using tampons, and having sex for at least 3 days after the procedure or for as long as told by your health care provider. Get help right away if you have heavy bleeding, severe pain, or signs of infection. This information is not intended to replace advice given to you by your health care provider. Make sure you discuss any questions you have with your health care provider. Document Revised: 12/31/2020 Document Reviewed: 12/31/2020 Elsevier Patient Education  2024 [employer].

## 2024-04-27 ENCOUNTER — Ambulatory Visit: Payer: Self-pay | Admitting: Obstetrics and Gynecology

## 2024-04-27 LAB — SURGICAL PATHOLOGY

## 2024-05-31 ENCOUNTER — Other Ambulatory Visit: Payer: Self-pay | Admitting: *Deleted

## 2024-05-31 ENCOUNTER — Encounter: Payer: Self-pay | Admitting: Obstetrics and Gynecology

## 2024-05-31 ENCOUNTER — Ambulatory Visit: Admitting: Obstetrics and Gynecology

## 2024-05-31 VITALS — BP 126/84 | HR 86

## 2024-05-31 DIAGNOSIS — N871 Moderate cervical dysplasia: Secondary | ICD-10-CM

## 2024-05-31 DIAGNOSIS — N72 Inflammatory disease of cervix uteri: Secondary | ICD-10-CM

## 2024-05-31 MED ORDER — DOXYCYCLINE HYCLATE 100 MG PO CAPS: 100.0000 mg | ORAL_CAPSULE | Freq: Two times a day (BID) | ORAL | 0 refills | Status: DC

## 2024-05-31 NOTE — Patient Instructions (Signed)
 Loop Electrosurgical Excision Procedure Loop electrosurgical excision procedure (LEEP) is the cutting and removal (excision) of tissue from the cervix. The cervix is the bottom part of the uterus that opens into the vagina. The tissue that is removed from the cervix is examined to see if there are cancer cells or cells that might turn into cancer (precancerous cells). LEEP may be done when: You have abnormal bleeding from your cervix. You have an abnormal Pap test result. Your health care provider finds abnormalities on your cervix during an exam. LEEP typically only takes a few minutes and is often done in the health care provider's office. The procedure is safe for women who are trying to get pregnant. The procedure is usually not done during a menstrual period or during pregnancy. Tell a health care provider about: Any allergies you have. All medicines you are taking, including vitamins, herbs, eye drops, creams, and over-the-counter medicines. Any problems you or family members have had with anesthetic medicines. Any bleeding problems you have. Any medical conditions you have or have had. This includes current or past vaginal infections, such as herpes or STIs (sexually transmitted infections). Whether you are pregnant or may be pregnant. If you are having vaginal bleeding on the day of the procedure. What are the risks? Generally, this is a safe procedure. However, problems may occur, including: Infection. Bleeding. Allergic reactions to medicines. Changes or scarring in the cervix. Damage to nearby structures or organs. Increased risk of early (preterm) labor in future pregnancies. What happens before the procedure? Ask your health care provider about: Changing or stopping your regular medicines. This is especially important if you are taking diabetes medicines or blood thinners. Taking medicines such as aspirin and ibuprofen . These medicines can thin your blood. Do not take these  medicines unless your health care provider tells you to take them. Taking over-the-counter medicines, vitamins, herbs, and supplements. Your health care provider may recommend that you take pain medicine before the procedure. Ask your health care provider if you should plan to have a responsible adult take you home after the procedure. What happens during the procedure?  An instrument called a speculum will be placed in your vagina. This will allow your health care provider to see your cervix. You will be given a medicine to numb the area (local anesthetic). The medicine will be injected into your cervix and the surrounding area. A solution will be applied to your cervix. This solution will help the health care provider find the abnormal cells that need to be removed. A thin wire loop will be passed through your vagina to your cervix. The wire will remove layers of abnormal cervical cells. The wire will burn (cauterize) the cervical tissue with an electrical current during cell removal. Open blood vessels will be cauterized to prevent bleeding. You might feel some pressure, aching, and cramping. If you feel like you will faint during the procedure, tell your health care provider right away. A paste may be applied to the cauterized area of your cervix to help control bleeding. The sample of cervical tissue will be sent to a lab and looked at under a microscope. The procedure may vary among health care providers and hospitals. What can I expect after the procedure? After the procedure, it is common to have: Mild abdominal cramps that may last for up to 1 week. A small amount of pink-tinged or bloody vaginal discharge, including light to moderate bleeding, for 1-2 weeks. A brown- or black-colored discharge coming from your  vagina, if a paste was used on the cervix to control bleeding. It is up to you to get the results of your procedure. Ask your health care provider, or the department that is  doing the procedure, when your results will be ready. Follow these instructions at home: Take over-the-counter and prescription medicines only as told by your health care provider. Return to your normal activities as told by your health care provider. Ask your health care provider what activities are safe for you. Do not put anything in your vagina for 2 weeks after the procedure or until your health care provider says that it is okay. This includes tampons, creams, and douches. Do not have sex until your health care provider approves. Keep all follow-up visits. This is important. Contact a health care provider if: You have a fever or chills. You feel very weak. You have blood clots or bleeding that is heavier than a normal menstrual period. Bleeding that soaks a pad in less than 1 hour is considered heavy bleeding. You develop a bad-smelling discharge from your vagina. You have severe abdominal pain or cramping. Summary Loop electrosurgical excision procedure (LEEP) is the removal of tissue from the cervix. The removed tissue will be checked for precancerous cells or cancer cells. LEEP typically only takes a few minutes and is often done in your health care provider's office. Do not put anything in your vagina for 2 weeks after the procedure or until your health care provider says that it is okay. This includes tampons, creams, and douches. Ask your health care provider, or the department that is doing the procedure, when your results will be ready. This information is not intended to replace advice given to you by your health care provider. Make sure you discuss any questions you have with your health care provider. Document Revised: 01/10/2021 Document Reviewed: 01/10/2021 Elsevier Patient Education  2024 [employer].

## 2024-05-31 NOTE — Progress Notes (Signed)
 GYNECOLOGY  VISIT   HPI: 58 y.o.   Married  Caucasian female   [id number] with No LMP recorded. (Menstrual status: IUD).   here for: LEEP Consult to determine if her LEEP will be done in office or outpatient in hospital setting.   Colposcopy done 04/26/24 for raised lesion of her cervix at time of her routine annual exam.  Her pap was normal on 03/04/24, and her HR HPV testing was negative. 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  She has a Mirena  IUD.      GYNECOLOGIC HISTORY: No LMP recorded. (Menstrual status: IUD). Contraception:  Mirena  IUD- 11/05/17  Menopausal hormone therapy:  n/a Last 2 paps:  03/04/24 neg HR HPV neg, 10/30/22 ASCUS, HR HPV neg  History of abnormal Pap or positive HPV:  yes Mammogram:  11/19/23 R Breast- Breast Density Cat B, BIRADS Cat 2 benign         OB History     Gravida  2   Para  2   Term  0   Preterm  0   AB  0   Living  2      SAB  0   IAB  0   Ectopic  0   Multiple  0   Live Births  0              There are no active problems to display for this patient.   Past Medical History:  Diagnosis Date   Hypertension    on meds    Past Surgical History:  Procedure Laterality Date   COLONOSCOPY  03/08/2019   KB-MAC-Suprep(exc)-polyps-58yrs   MOUTH SURGERY      Current Outpatient Medications  Medication Sig Dispense Refill   amLODipine (NORVASC) 5 MG tablet Take 5 mg by mouth daily.     hydrochlorothiazide (HYDRODIURIL) 25 MG tablet TK 1 T PO D FOR BP     ibuprofen (ADVIL) 200 MG tablet Take 200 mg by mouth every 6 (six) hours as needed for mild pain.     phentermine (ADIPEX-P) 37.5 MG tablet Take 37.5 mg by mouth daily before breakfast.     ALPRAZolam  (XANAX ) 0.25 MG tablet Take one tablet (0.25 mg) by mouth one hour prior to procedure. (Patient not taking: Reported on 05/31/2024) 2 tablet 0   No current  facility-administered medications for this visit.     ALLERGIES: Patient has no known allergies.  Family History  Problem Relation Age of Onset   Hypertension Mother    Diverticulitis Mother    Diverticulosis Mother    Ulcerative colitis Father    Colon polyps Father    Hypertension Father    Breast cancer Maternal Grandmother        deceased at 95   Lung cancer Maternal Grandfather    Non-Hodgkin's lymphoma Paternal Grandmother    Cancer Paternal Grandfather    Colon cancer Neg Hx    Esophageal cancer Neg Hx    Rectal cancer Neg Hx    Stomach cancer Neg Hx     Social History   Socioeconomic History   Marital status: Married    Spouse name: Not on file   Number of children: Not on file   Years of education: Not on file   Highest education level: Not on file  Occupational History   Not on file  Tobacco Use   Smoking status:  Former    Types: Cigarettes   Smokeless tobacco: Never  Vaping Use   Vaping status: Never Used  Substance and Sexual Activity   Alcohol use: Yes    Comment: special occasions   Drug use: No   Sexual activity: Yes    Partners: Male    Birth control/protection: I.U.D.    Comment: Mirena  IUD- 11/05/17  Other Topics Concern   Not on file  Social History Narrative   Not on file   Social Drivers of Health   Financial Resource Strain: Not on file  Food Insecurity: Not on file  Transportation Needs: Not on file  Physical Activity: Not on file  Stress: Not on file  Social Connections: Not on file  Intimate Partner Violence: Not on file    Review of Systems  All other systems reviewed and are negative.   PHYSICAL EXAMINATION:   BP 126/84 (BP Location: Right Arm, Patient Position: Sitting)   Pulse 86   SpO2 98%     General appearance: alert, cooperative and appears stated age  Pelvic: External genitalia:  no lesions              Urethra:  normal appearing urethra with no masses, tenderness or lesions              Bartholins and  Skenes: normal                 Vagina: normal appearing vagina with normal color and discharge, no lesions              Cervix: large white lesion at 12:00, large transformation zone.  IUD strings noted.                 Bimanual Exam:  Uterus:  normal size, contour, position, consistency, mobility, non-tender              Adnexa: no mass, fullness, tenderness         Chaperone was present for exam:  Kari HERO, CMA  ASSESSMENT:  CIN II.  Cervicitis, acute and chronic.  Mirena  IUD.  PLAN:  Doxycycline 100 mg po bid x 7 days.   We discussed cervical dysplasia and treatment with LEEP procedure.  May need IUD strings cut short and tucked inside the cervix for the procedure.   I recommend this in an outpatient surgical setting due to the large treatment area confirmed today.  Will refer to surgical colleague at [facility].  Anticipated pap and HR HPV testing in 6 months.   20 min  total time was spent for this patient encounter, including preparation, face-to-face counseling with the patient, coordination of care, and documentation of the encounter.

## 2024-05-31 NOTE — Telephone Encounter (Signed)
 Dr. Nikki -can you review the Rx for doxycycline 100 mg sent today. Curtiss ,Pharmacist from [employer],  called to confirm. BID for 7 days or 14 days?

## 2024-06-03 ENCOUNTER — Ambulatory Visit: Admitting: Obstetrics and Gynecology

## 2024-06-14 ENCOUNTER — Encounter: Payer: Self-pay | Admitting: Obstetrics and Gynecology

## 2024-06-14 ENCOUNTER — Ambulatory Visit (INDEPENDENT_AMBULATORY_CARE_PROVIDER_SITE_OTHER): Admitting: Obstetrics and Gynecology

## 2024-06-14 VITALS — BP 122/72 | HR 64 | Ht 63.58 in | Wt 192.2 lb

## 2024-06-14 DIAGNOSIS — N871 Moderate cervical dysplasia: Secondary | ICD-10-CM

## 2024-06-14 NOTE — Patient Instructions (Addendum)
 Perimenopause/Menopause suggestions   You should be getting 1,200 mg of calcium a day between your diet and supplements and at least 3000 IU a day of Vit D. You should exercise regularly with weight bearing exercises. I would recommend a bone density q 2 years. We could run one now with risks ie smoking in the past.  We loose 5% per year in this time period of our bone density, due to the loss of estrogen. Magnesium at bedtime for our sleep and bones (follow recommended dose on bottle)  Please read The New Menopause my Dr. Ronal Stagger Haver  Try to avoid caffeine and alcohol in this period, as they can make symptoms worse  Continue annual mammograms, unless told sooner  Please reach out with any questions Almarie MARLA Carpen   I will have the front desk contact you on the cost for the gardesil vaccination

## 2024-06-14 NOTE — Progress Notes (Signed)
   Acute Office Visit  Subjective:    Patient ID: Lacey Baker, female    DOB: 02-18-1966, 58 y.o.   MRN: [id number]   HPI 58 y.o. presents today for Consult (Sx consult /PAP-03/04/24/Mammo-11/19/23/Colonoscopy-11/06/22) Consult for CKC with   Former smoker for 3 years Gardesil: none. Offered off label  10/30/22 ASCUS HPV DNA neg 12/16/22 Colpo ECC pos LGSIL, 6 oclock CIN1 03/04/24 normal pap smear  Larger lesion seen on exam recommended LEEP vs CKC in OR. Pleased with her IUD and considering HRT soon. No LMP recorded. (Menstrual status: IUD).    Review of Systems     Objective:    OBGyn Exam  BP 122/72   Pulse 64   Ht 5' 3.58 (1.615 m)   Wt 192 lb 3.2 oz (87.2 kg)   SpO2 98%   BMI 33.43 kg/m  Wt Readings from Last 3 Encounters:  06/14/24 192 lb 3.2 oz (87.2 kg)  03/04/24 188 lb (85.3 kg)  12/16/22 178 lb (80.7 kg)         Assessment & Plan:  CIN with positive ECC, mirena  IUD in place Discussed LEEP vs. CKC and cure rates of 40%, 80% respectively. Discussed cone would allow further sample into the endocervix. Strings may be cut or IUD could be removed and patient expressed she would like a new IUD placed.  Discussed benefit with the IUD and managing HRT/estrogen patch. She would like surgery before the end of the year.   Counseled on off label use of gardesil, if she would like. Discussed she will still need repeat pap smears in the future. All questions answered.  30 minutes spent on reviewing records, imaging,  and one on one patient time and counseling patient and documentation Dr. Glennon Almarie MARLA Glennon

## 2024-06-29 ENCOUNTER — Encounter: Payer: Self-pay | Admitting: Obstetrics and Gynecology

## 2024-06-29 NOTE — Telephone Encounter (Signed)
 See surgery referral.   Encounter closed.

## 2024-07-22 ENCOUNTER — Ambulatory Visit (INDEPENDENT_AMBULATORY_CARE_PROVIDER_SITE_OTHER): Admitting: Obstetrics and Gynecology

## 2024-07-22 VITALS — BP 124/84 | HR 84 | Ht 64.0 in | Wt 193.6 lb

## 2024-07-22 DIAGNOSIS — N871 Moderate cervical dysplasia: Secondary | ICD-10-CM

## 2024-07-22 DIAGNOSIS — Z9189 Other specified personal risk factors, not elsewhere classified: Secondary | ICD-10-CM

## 2024-07-22 DIAGNOSIS — E7800 Pure hypercholesterolemia, unspecified: Secondary | ICD-10-CM

## 2024-07-22 DIAGNOSIS — Z01818 Encounter for other preprocedural examination: Secondary | ICD-10-CM

## 2024-07-22 MED ORDER — ESTRADIOL 0.0375 MG/24HR TD PTTW: 1.0000 | MEDICATED_PATCH | TRANSDERMAL | 12 refills | Status: AC

## 2024-07-22 MED ORDER — METOCLOPRAMIDE HCL 10 MG PO TABS: 10.0000 mg | ORAL_TABLET | Freq: Three times a day (TID) | ORAL | 0 refills | Status: AC | PRN

## 2024-07-22 MED ORDER — IBUPROFEN 200 MG PO TABS: 200.0000 mg | ORAL_TABLET | Freq: Four times a day (QID) | ORAL | 2 refills | Status: AC | PRN

## 2024-07-22 NOTE — H&P (View-Only) (Signed)
 PREOP CKC with ECC before and after the procedure, removal and placement of new mirena  IUD  Subjective:    Patient ID: Lacey Baker, female    DOB: Nov 09, 1965, 58 y.o.   MRN: [id number]   HPI 58 y.o. presents today for Procedure (Pre-Op) Consult for CKC with   Former smoker for 3 years Gardesil: none. Offered off label.  Insurance cost was too expensive   10/30/22 ASCUS HPV DNA neg 12/16/22 Colpo ECC pos LGSIL, 6 oclock CINI1 03/04/24 normal pap smear 04/26/24 CINII  Order: [id number]  Status: Edited Result - FINAL     Next appt: 09/06/2024 at 07:00 AM in Radiology ([facility]-CT 1)     Dx: Lesion of cervix   Test Result Released: Yes (seen)     Messages: Seen   2 Result Notes     1 Patient Communication     View Follow-Up Encounter    Component Ref Range & Units (hover) 2 mo ago  SURGICAL PATHOLOGY SURGICAL PATHOLOGY CASE: [id number] PATIENT: Lacey Baker Surgical Pathology Report     Clinical History: lesion of cervix (cm)     FINAL MICROSCOPIC DIAGNOSIS:  A. ENDOCERVIX, CURETTAGE: Detached strips of dysplastic metaplastic epithelium, at least low-grade dysplasia Mucoinflammatory and squamous debris with admixed benign endocervical epithelium and endocervix showing acute and chronic endocervicitis and squamous metaplasia  B. 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 Pleased with her IUD and considering HRT soon. No LMP recorded. (Menstrual status: IUD).    Review of Systems     Objective:    OBGyn Exam  BP 124/84 (BP Location: Left Arm, Patient Position: Sitting, Cuff Size: Normal)   Pulse 84   Ht 5' 4 (1.626 m)   Wt 193 lb 9.6 oz (87.8 kg)   SpO2 99%   BMI 33.23 kg/m  Wt Readings from Last 3 Encounters:  07/22/24 193 lb 9.6 oz (87.8 kg)  06/14/24  192 lb 3.2 oz (87.2 kg)  03/04/24 188 lb (85.3 kg)      OB History     Gravida  2   Para  2   Term  0   Preterm  0   AB  0   Living  2      SAB  0   IAB  0   Ectopic  0   Multiple  0   Live Births  0          Social History   Socioeconomic History   Marital status: Married    Spouse name: Not on file   Number of children: Not on file   Years of education: Not on file   Highest education level: Not on file  Occupational History   Not on file  Tobacco Use   Smoking status: Former    Types: Cigarettes   Smokeless tobacco: Never  Vaping Use   Vaping status: Never Used  Substance and Sexual Activity   Alcohol use: Yes    Comment: special occasions   Drug use: No   Sexual activity: Yes    Partners: Male    Birth control/protection: I.U.D.    Comment: Mirena  IUD- 11/05/17  Other Topics Concern   Not on file  Social History Narrative   Not on file   Social Drivers of Health   Financial Resource Strain: Not on  file  Food Insecurity: Not on file  Transportation Needs: Not on file  Physical Activity: Not on file  Stress: Not on file  Social Connections: Not on file  Intimate Partner Violence: Not on file   Past Surgical History:  Procedure Laterality Date   COLONOSCOPY  03/08/2019   KB-MAC-Suprep(exc)-polyps-43yrs   MOUTH SURGERY     Past Medical History:  Diagnosis Date   CIN II (cervical intraepithelial neoplasia II)    Hypertension    on meds   No Known Allergies Current Outpatient Medications on File Prior to Visit  Medication Sig Dispense Refill   amLODipine (NORVASC) 5 MG tablet Take 5 mg by mouth daily.     hydrochlorothiazide (HYDRODIURIL) 25 MG tablet TK 1 T PO D FOR BP     ALPRAZolam  (XANAX ) 0.25 MG tablet Take one tablet (0.25 mg) by mouth one hour prior to procedure. (Patient not taking: Reported on 07/22/2024) 2 tablet 0   doxycycline  (VIBRAMYCIN ) 100 MG capsule Take 1 capsule (100 mg total) by mouth 2 (two) times daily. Take  BID for 7 days.  Take with food as can cause GI distress. (Patient not taking: Reported on 07/22/2024) 14 capsule 0   phentermine (ADIPEX-P) 37.5 MG tablet Take 37.5 mg by mouth daily before breakfast. (Patient not taking: Reported on 07/22/2024)     No current facility-administered medications on file prior to visit.   S1S2 CTA B/L Soft, NT   Assessment & Plan:  CIN with positive ECC, mirena  IUD in place PREOP for CKC, ECC before and after, removal and placement of new mirena  IUD  Discussed LEEP vs. CKC and cure rates of 40%, 80% respectively. Discussed cone would allow further sample into the endocervix. Strings may be cut or IUD could be removed and patient expressed she would like a new IUD placed.  Discussed benefit with the IUD and managing HRT/estrogen patch. She would like surgery before the end of the year.   Counseled on off label use of gardesil, if she would like. Discussed she will still need repeat pap smears in the future. All questions answered.    All options discussed with patient. Procedure reviewed in detail again with patient. The risks of surgery were discussed in detail with the patient including but not limited to: bleeding which may require transfusion or reoperation; infection which may require prolonged hospitalization or re-hospitalization and antibiotic therapy; injury to bowel, bladder, ureters and major vessels or other surrounding organs which may lead to other procedures; formation of adhesions; need for additional procedures including laparotomy or subsequent procedures secondary to intraoperative injury or abnormal pathology; thromboembolic phenomenon; incisional problems and other postoperative or anesthesia complications.  The postoperative expectations were also discussed in detail. The patient also understands the alternative treatment options which were discussed in full. All questions were answered.  Patient would like to proceed with the  procedure.  Pelvic rest at least a month after and prepared patient for discharge and surgicel that will fall out when the stitches dissolve, so she is prepared.  To begin vaginal estrogen cream for healing. Can begin vivelle  dot now on low dose twice weekly for HRT management. Counseled on the r/b/a/I of the HRT. CT calcium cardiac scoring: family history of MI and risks with menopause. Discussed self pay of about 89$ for the test.  She would like to run this.  To also use for preop evaluation.  30 minutes spent on reviewing records, imaging,  and one on one patient time and counseling  patient and documentation Dr. Glennon Almarie MARLA Glennon

## 2024-07-22 NOTE — Patient Instructions (Signed)
 Post-procedure Instructions after myosure D&C, ECC Cramping is common.  You may take Ibuprofen, Aleve, or Tylenol for the cramping.  This should resolve within the next two to three days.   You may have bright red spotting or blackish discharge for several days after your procedure.  There is also a piece of mesh like material that is used to reduce blood loss that will fall out by out 3 weeks.  The discharge occurs because of a topical solution used to stop bleeding at the biopsy site(s).  The dark discharge will lighten and then turn clear before completely resolving.  You will need to wear a mini pad during this time. SABRA Refrain from putting anything in the vagina for 4-6 weeks You need to call the office if you have any pelvic pain, fever, heavy bleeding, dysuria, foul smelling vaginal discharge, or if you are concerned. Showers only for 2 weeks You will be notified within one week of your biopsy results or we will discuss your results at your follow-up appointment if needed.

## 2024-07-22 NOTE — Progress Notes (Signed)
 PREOP CKC with ECC before and after the procedure, removal and placement of new mirena  IUD  Subjective:    Patient ID: Lacey Baker, female    DOB: Nov 09, 1965, 58 y.o.   MRN: [id number]   HPI 58 y.o. presents today for Procedure (Pre-Op) Consult for CKC with   Former smoker for 3 years Gardesil: none. Offered off label.  Insurance cost was too expensive   10/30/22 ASCUS HPV DNA neg 12/16/22 [facility]po ECC pos LGSIL, 6 oclock CINI1 03/04/24 normal pap smear 04/26/24 CINII  Order: [id number]  Status: Edited Result - FINAL     Next appt: 09/06/2024 at 07:00 AM in Radiology ([facility]CT 1)     Dx: Lesion of cervix   Test Result Released: Yes (seen)     Messages: Seen   2 Result Notes     1 Patient Communication     View Follow-Up Encounter    Component Ref Range & Units (hover) 2 mo ago  SURGICAL PATHOLOGY SURGICAL PATHOLOGY CASE: [id number] PATIENT: Lacey Baker Surgical Pathology Report     Clinical History: lesion of cervix (cm)     FINAL MICROSCOPIC DIAGNOSIS:  A. ENDOCERVIX, CURETTAGE: Detached strips of dysplastic metaplastic epithelium, at least low-grade dysplasia Mucoinflammatory and squamous debris with admixed benign endocervical epithelium and endocervix showing acute and chronic endocervicitis and squamous metaplasia  B. 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 Pleased with her IUD and considering HRT soon. No LMP recorded. (Menstrual status: IUD).    Review of Systems     Objective:    OBGyn Exam  BP 124/84 (BP Location: Left Arm, Patient Position: Sitting, Cuff Size: Normal)   Pulse 84   Ht 5' 4 (1.626 m)   Wt 193 lb 9.6 oz (87.8 kg)   SpO2 99%   BMI 33.23 kg/m  Wt Readings from Last 3 Encounters:  07/22/24 193 lb 9.6 oz (87.8 kg)  06/14/24  192 lb 3.2 oz (87.2 kg)  03/04/24 188 lb (85.3 kg)      OB History     Gravida  2   Para  2   Term  0   Preterm  0   AB  0   Living  2      SAB  0   IAB  0   Ectopic  0   Multiple  0   Live Births  0          Social History   Socioeconomic History   Marital status: Married    Spouse name: Not on file   Number of children: Not on file   Years of education: Not on file   Highest education level: Not on file  Occupational History   Not on file  Tobacco Use   Smoking status: Former    Types: Cigarettes   Smokeless tobacco: Never  Vaping Use   Vaping status: Never Used  Substance and Sexual Activity   Alcohol use: Yes    Comment: special occasions   Drug use: No   Sexual activity: Yes    Partners: Male    Birth control/protection: I.U.D.    Comment: Mirena  IUD- 11/05/17  Other Topics Concern   Not on file  Social History Narrative   Not on file   Social Drivers of Health   Financial Resource Strain: Not on  file  Food Insecurity: Not on file  Transportation Needs: Not on file  Physical Activity: Not on file  Stress: Not on file  Social Connections: Not on file  Intimate Partner Violence: Not on file   Past Surgical History:  Procedure Laterality Date   COLONOSCOPY  03/08/2019   KB-MAC-Suprep(exc)-polyps-43yrs   MOUTH SURGERY     Past Medical History:  Diagnosis Date   CIN II (cervical intraepithelial neoplasia II)    Hypertension    on meds   No Known Allergies Current Outpatient Medications on File Prior to Visit  Medication Sig Dispense Refill   amLODipine (NORVASC) 5 MG tablet Take 5 mg by mouth daily.     hydrochlorothiazide (HYDRODIURIL) 25 MG tablet TK 1 T PO D FOR BP     ALPRAZolam  (XANAX ) 0.25 MG tablet Take one tablet (0.25 mg) by mouth one hour prior to procedure. (Patient not taking: Reported on 07/22/2024) 2 tablet 0   doxycycline  (VIBRAMYCIN ) 100 MG capsule Take 1 capsule (100 mg total) by mouth 2 (two) times daily. Take  BID for 7 days.  Take with food as can cause GI distress. (Patient not taking: Reported on 07/22/2024) 14 capsule 0   phentermine (ADIPEX-P) 37.5 MG tablet Take 37.5 mg by mouth daily before breakfast. (Patient not taking: Reported on 07/22/2024)     No current facility-administered medications on file prior to visit.   S1S2 CTA B/L Soft, NT   Assessment & Plan:  CIN with positive ECC, mirena  IUD in place PREOP for CKC, ECC before and after, removal and placement of new mirena  IUD  Discussed LEEP vs. CKC and cure rates of 40%, 80% respectively. Discussed cone would allow further sample into the endocervix. Strings may be cut or IUD could be removed and patient expressed she would like a new IUD placed.  Discussed benefit with the IUD and managing HRT/estrogen patch. She would like surgery before the end of the year.   Counseled on off label use of gardesil, if she would like. Discussed she will still need repeat pap smears in the future. All questions answered.    All options discussed with patient. Procedure reviewed in detail again with patient. The risks of surgery were discussed in detail with the patient including but not limited to: bleeding which may require transfusion or reoperation; infection which may require prolonged hospitalization or re-hospitalization and antibiotic therapy; injury to bowel, bladder, ureters and major vessels or other surrounding organs which may lead to other procedures; formation of adhesions; need for additional procedures including laparotomy or subsequent procedures secondary to intraoperative injury or abnormal pathology; thromboembolic phenomenon; incisional problems and other postoperative or anesthesia complications.  The postoperative expectations were also discussed in detail. The patient also understands the alternative treatment options which were discussed in full. All questions were answered.  Patient would like to proceed with the  procedure.  Pelvic rest at least a month after and prepared patient for discharge and surgicel that will fall out when the stitches dissolve, so she is prepared.  To begin vaginal estrogen cream for healing. Can begin vivelle  dot now on low dose twice weekly for HRT management. Counseled on the r/b/a/I of the HRT. CT calcium cardiac scoring: family history of MI and risks with menopause. Discussed self pay of about 89$ for the test.  She would like to run this.  To also use for preop evaluation.  30 minutes spent on reviewing records, imaging,  and one on one patient time and counseling  patient and documentation Dr. Glennon Almarie MARLA Glennon

## 2024-08-02 ENCOUNTER — Encounter: Payer: Self-pay | Admitting: *Deleted

## 2024-08-03 ENCOUNTER — Encounter (HOSPITAL_COMMUNITY): Payer: Self-pay | Admitting: Obstetrics and Gynecology

## 2024-08-03 NOTE — Progress Notes (Signed)
 Spoke w/ via phone for pre-op interview--- [facility] Lab needs dos----  BMP and EKG per anesthesia. Surgeon orders requested.       Lab results------ COVID test -----patient states asymptomatic no test needed Arrive at -------1030 NPO after MN NO Solid Food.  Clear liquids from MN until---0930 Pre-Surgery Ensure or G2:  Med rec completed Medications to take morning of surgery -----NONE Diabetic medication -----  GLP1 agonist last dose: GLP1 instructions:  Patient instructed no nail polish to be worn day of surgery Patient instructed to bring photo id and insurance card day of surgery Patient aware to have Driver (ride ) / caregiver    for 24 hours after surgery - Husband Debby Cook Patient Special Instructions ----- Pre-Op special Instructions -----  Patient verbalized understanding of instructions that were given at this phone interview. Patient denies chest pain, sob, fever, cough at the interview.

## 2024-08-10 ENCOUNTER — Ambulatory Visit (HOSPITAL_BASED_OUTPATIENT_CLINIC_OR_DEPARTMENT_OTHER): Payer: Self-pay | Admitting: Anesthesiology

## 2024-08-10 ENCOUNTER — Encounter (HOSPITAL_COMMUNITY): Admission: RE | Disposition: A | Payer: Self-pay | Source: Home / Self Care | Attending: Obstetrics and Gynecology

## 2024-08-10 ENCOUNTER — Encounter (HOSPITAL_COMMUNITY): Payer: Self-pay | Admitting: Anesthesiology

## 2024-08-10 ENCOUNTER — Ambulatory Visit (HOSPITAL_COMMUNITY)
Admission: RE | Admit: 2024-08-10 | Discharge: 2024-08-10 | Disposition: A | Discharge status: Home / Self Care | Attending: Obstetrics and Gynecology | Admitting: Obstetrics and Gynecology

## 2024-08-10 ENCOUNTER — Encounter (HOSPITAL_COMMUNITY): Payer: Self-pay | Admitting: Obstetrics and Gynecology

## 2024-08-10 DIAGNOSIS — Z30433 Encounter for removal and reinsertion of intrauterine contraceptive device: Secondary | ICD-10-CM | POA: Insufficient documentation

## 2024-08-10 DIAGNOSIS — I10 Essential (primary) hypertension: Secondary | ICD-10-CM | POA: Diagnosis not present

## 2024-08-10 DIAGNOSIS — Z8249 Family history of ischemic heart disease and other diseases of the circulatory system: Secondary | ICD-10-CM | POA: Insufficient documentation

## 2024-08-10 DIAGNOSIS — N871 Moderate cervical dysplasia: Secondary | ICD-10-CM | POA: Diagnosis present

## 2024-08-10 DIAGNOSIS — Z87891 Personal history of nicotine dependence: Secondary | ICD-10-CM

## 2024-08-10 DIAGNOSIS — Z79899 Other long term (current) drug therapy: Secondary | ICD-10-CM | POA: Diagnosis not present

## 2024-08-10 DIAGNOSIS — Z79890 Hormone replacement therapy (postmenopausal): Secondary | ICD-10-CM | POA: Diagnosis not present

## 2024-08-10 HISTORY — PX: CERVICAL CONIZATION W/BX: SHX1330

## 2024-08-10 HISTORY — PX: INTRAUTERINE DEVICE (IUD) INSERTION: SHX5877

## 2024-08-10 HISTORY — PX: IUD REMOVAL: SHX5392

## 2024-08-10 LAB — CBC
HCT: 32.7 % — ABNORMAL LOW (ref 36.0–46.0)
Hemoglobin: 10.7 g/dL — ABNORMAL LOW (ref 12.0–15.0)
MCH: 28.2 pg (ref 26.0–34.0)
MCHC: 32.7 g/dL (ref 30.0–36.0)
MCV: 86.3 fL (ref 80.0–100.0)
Platelets: 247 K/uL (ref 150–400)
RBC: 3.79 MIL/uL — ABNORMAL LOW (ref 3.87–5.11)
RDW: 13.8 % (ref 11.5–15.5)
WBC: 5 K/uL (ref 4.0–10.5)
nRBC: 0 % (ref 0.0–0.2)

## 2024-08-10 LAB — ABO/RH: ABO/RH(D): AB POS

## 2024-08-10 LAB — BASIC METABOLIC PANEL WITH GFR
Anion gap: 15 (ref 5–15)
BUN: 12 mg/dL (ref 6–20)
CO2: 21 mmol/L — ABNORMAL LOW (ref 22–32)
Calcium: 8.6 mg/dL — ABNORMAL LOW (ref 8.9–10.3)
Chloride: 101 mmol/L (ref 98–111)
Creatinine, Ser: 0.65 mg/dL (ref 0.44–1.00)
GFR, Estimated: >60 mL/min
Glucose, Bld: 87 mg/dL (ref 70–99)
Potassium: 3.4 mmol/L — ABNORMAL LOW (ref 3.5–5.1)
Sodium: 137 mmol/L (ref 135–145)

## 2024-08-10 LAB — TYPE AND SCREEN
ABO/RH(D): AB POS
Antibody Screen: NEGATIVE

## 2024-08-10 LAB — POCT PREGNANCY, URINE: Preg Test, Ur: NEGATIVE

## 2024-08-10 SURGERY — CONE BIOPSY, CERVIX
Anesthesia: Monitor Anesthesia Care | Site: Uterus

## 2024-08-10 MED ORDER — 0.9 % SODIUM CHLORIDE (POUR BTL) OPTIME
TOPICAL | Status: DC | PRN
  Administered 2024-08-10: 1000 mL

## 2024-08-10 MED ORDER — FENTANYL CITRATE (PF) 100 MCG/2ML IJ SOLN: 25.0000 ug | INTRAMUSCULAR | Status: DC | PRN

## 2024-08-10 MED ORDER — ONDANSETRON HCL 4 MG/2ML IJ SOLN
INTRAMUSCULAR | Status: AC
  Filled 2024-08-10: qty 2

## 2024-08-10 MED ORDER — IODINE STRONG (LUGOLS) 5 % PO SOLN
ORAL | Status: AC
  Filled 2024-08-10: qty 1

## 2024-08-10 MED ORDER — LIDOCAINE-EPINEPHRINE (PF) 1 %-1:200000 IJ SOLN
INTRAMUSCULAR | Status: DC | PRN
  Administered 2024-08-10: 17 mL

## 2024-08-10 MED ORDER — OXYCODONE HCL 5 MG/5ML PO SOLN: 5.0000 mg | Freq: Once | ORAL | Status: DC | PRN

## 2024-08-10 MED ORDER — CHLORHEXIDINE GLUCONATE 0.12 % MT SOLN
OROMUCOSAL | Status: AC
  Filled 2024-08-10: qty 15

## 2024-08-10 MED ORDER — FENTANYL CITRATE (PF) 100 MCG/2ML IJ SOLN
INTRAMUSCULAR | Status: AC
  Filled 2024-08-10: qty 2

## 2024-08-10 MED ORDER — ACETAMINOPHEN 500 MG PO TABS
ORAL_TABLET | ORAL | Status: AC
  Filled 2024-08-10: qty 2

## 2024-08-10 MED ORDER — LEVONORGESTREL 20 MCG/DAY IU IUD
1.0000 | INTRAUTERINE_SYSTEM | INTRAUTERINE | Status: AC
  Administered 2024-08-10: 1 via INTRAUTERINE

## 2024-08-10 MED ORDER — DEXAMETHASONE SOD PHOSPHATE PF 10 MG/ML IJ SOLN
INTRAMUSCULAR | Status: DC | PRN
  Administered 2024-08-10: 10 mg via INTRAVENOUS

## 2024-08-10 MED ORDER — PROPOFOL 10 MG/ML IV BOLUS
INTRAVENOUS | Status: DC | PRN
  Administered 2024-08-10 (×3): 50 mg via INTRAVENOUS

## 2024-08-10 MED ORDER — IODINE STRONG (LUGOLS) 5 % PO SOLN
ORAL | Status: DC | PRN
  Administered 2024-08-10: .1 mL

## 2024-08-10 MED ORDER — ACETAMINOPHEN 500 MG PO TABS
1000.0000 mg | ORAL_TABLET | ORAL | Status: AC
  Administered 2024-08-10: 1000 mg via ORAL

## 2024-08-10 MED ORDER — KETOROLAC TROMETHAMINE 30 MG/ML IJ SOLN
INTRAMUSCULAR | Status: DC | PRN
  Administered 2024-08-10: 30 mg via INTRAVENOUS

## 2024-08-10 MED ORDER — LIDOCAINE 2% (20 MG/ML) 5 ML SYRINGE
INTRAMUSCULAR | Status: DC | PRN
  Administered 2024-08-10: 40 mg via INTRAVENOUS

## 2024-08-10 MED ORDER — PROPOFOL 500 MG/50ML IV EMUL
INTRAVENOUS | Status: DC | PRN
  Administered 2024-08-10: 75 ug/kg/min via INTRAVENOUS

## 2024-08-10 MED ORDER — HEMOSTATIC AGENTS (NO CHARGE) OPTIME
TOPICAL | Status: DC | PRN
  Administered 2024-08-10: 1

## 2024-08-10 MED ORDER — AMISULPRIDE (ANTIEMETIC) 5 MG/2ML IV SOLN: 10.0000 mg | Freq: Once | INTRAVENOUS | Status: DC | PRN

## 2024-08-10 MED ORDER — LIDOCAINE 2% (20 MG/ML) 5 ML SYRINGE
INTRAMUSCULAR | Status: AC
  Filled 2024-08-10: qty 5

## 2024-08-10 MED ORDER — OXYCODONE HCL 5 MG PO TABS: 5.0000 mg | ORAL_TABLET | Freq: Once | ORAL | Status: DC | PRN

## 2024-08-10 MED ORDER — FENTANYL CITRATE (PF) 100 MCG/2ML IJ SOLN
INTRAMUSCULAR | Status: DC | PRN
  Administered 2024-08-10 (×2): 50 ug via INTRAVENOUS

## 2024-08-10 MED ORDER — ACETIC ACID 5 % SOLN
Status: AC
  Filled 2024-08-10: qty 12

## 2024-08-10 MED ORDER — LIDOCAINE-EPINEPHRINE 1 %-1:100000 IJ SOLN
INTRAMUSCULAR | Status: AC
  Filled 2024-08-10: qty 1

## 2024-08-10 MED ORDER — KETOROLAC TROMETHAMINE 30 MG/ML IJ SOLN
INTRAMUSCULAR | Status: AC
  Filled 2024-08-10: qty 1

## 2024-08-10 MED ORDER — POVIDONE-IODINE 10 % EX SWAB: 2.0000 | Freq: Once | CUTANEOUS | Status: DC

## 2024-08-10 MED ORDER — CHLORHEXIDINE GLUCONATE 0.12 % MT SOLN
15.0000 mL | Freq: Once | OROMUCOSAL | Status: AC
  Administered 2024-08-10: 15 mL via OROMUCOSAL

## 2024-08-10 MED ORDER — MONSELS FERRIC SUBSULFATE EX SOLN
CUTANEOUS | Status: DC | PRN
  Administered 2024-08-10: 1 via TOPICAL

## 2024-08-10 MED ORDER — MIDAZOLAM HCL 2 MG/2ML IJ SOLN
INTRAMUSCULAR | Status: AC
  Filled 2024-08-10: qty 2

## 2024-08-10 MED ORDER — PROPOFOL 10 MG/ML IV BOLUS
INTRAVENOUS | Status: AC
  Filled 2024-08-10: qty 20

## 2024-08-10 MED ORDER — LACTATED RINGERS IV SOLN: INTRAVENOUS | Status: DC

## 2024-08-10 MED ORDER — DEXMEDETOMIDINE HCL IN NACL 80 MCG/20ML IV SOLN
INTRAVENOUS | Status: DC | PRN
  Administered 2024-08-10 (×2): 8 ug via INTRAVENOUS

## 2024-08-10 MED ORDER — ONDANSETRON HCL 4 MG/2ML IJ SOLN
INTRAMUSCULAR | Status: DC | PRN
  Administered 2024-08-10: 4 mg via INTRAVENOUS

## 2024-08-10 MED ORDER — MONSELS FERRIC SUBSULFATE EX SOLN
CUTANEOUS | Status: AC
  Filled 2024-08-10: qty 8

## 2024-08-10 MED ORDER — MIDAZOLAM HCL (PF) 2 MG/2ML IJ SOLN
INTRAMUSCULAR | Status: DC | PRN
  Administered 2024-08-10: 2 mg via INTRAVENOUS

## 2024-08-10 MED ORDER — ORAL CARE MOUTH RINSE: 15.0000 mL | Freq: Once | OROMUCOSAL | Status: AC

## 2024-08-10 SURGICAL SUPPLY — 20 items
BLADE SURG 11 STRL SS (BLADE) ×2 IMPLANT
CATH ROBINSON RED A/P 16FR (CATHETERS) IMPLANT
CURETTE PIPELLE ENDOMTRL SUCTN (MISCELLANEOUS) IMPLANT
ELECT BALL LEEP 5MM RED (ELECTRODE) IMPLANT
ELECTRODE LOOP LP RND 15X12GRN (CUTTING LOOP) IMPLANT
ELECTRODE LOOP LP RND 20X12WHT (CUTTING LOOP) IMPLANT
ELECTRODE REM PT RTRN 9FT ADLT (ELECTROSURGICAL) ×2 IMPLANT
EXTENDER ELECT LOOP LEEP 10CM (CUTTING LOOP) IMPLANT
GLOVE NEODERM STER SZ 7 (GLOVE) ×2 IMPLANT
GOWN STRL REUS W/ TWL LRG LVL3 (GOWN DISPOSABLE) ×2 IMPLANT
HEMOSTAT SURGICEL 2X14 (HEMOSTASIS) IMPLANT
MIRENA IUD IMPLANT
PACK VAGINAL WOMENS (CUSTOM PROCEDURE TRAY) ×2 IMPLANT
PAD OB MATERNITY 11 LF (PERSONAL CARE ITEMS) ×2 IMPLANT
SCOPETTES 8 STERILE (MISCELLANEOUS) ×2 IMPLANT
SOLN 0.9% NACL POUR BTL 1000ML (IV SOLUTION) ×2 IMPLANT
SUT VIC AB 2-0 CT1 (SUTURE) IMPLANT
SUT VIC AB 2-0 CT1 36 (SUTURE) IMPLANT
SUT VIC AB 2-0 UR6 27 (SUTURE) IMPLANT
TOWEL GREEN STERILE (TOWEL DISPOSABLE) ×2 IMPLANT

## 2024-08-10 NOTE — Anesthesia Preprocedure Evaluation (Addendum)
"                                    Anesthesia Evaluation  Patient identified by MRN, date of birth, ID band Patient awake    Reviewed: Allergy & Precautions, NPO status , Patient's Chart, lab work & pertinent test results  History of Anesthesia Complications Negative for: history of anesthetic complications  Airway Mallampati: III  TM Distance: >3 FB Neck ROM: Full    Dental  (+) Dental Advisory Given   Pulmonary neg shortness of breath, neg sleep apnea, neg COPD, neg recent URI, former smoker   Pulmonary exam normal breath sounds clear to auscultation       Cardiovascular hypertension (amlodipine, HCTZ), Pt. on medications (-) angina (-) Past MI, (-) Cardiac Stents and (-) CABG (-) dysrhythmias  Rhythm:Regular Rate:Normal     Neuro/Psych negative neurological ROS     GI/Hepatic negative GI ROS, Neg liver ROS,,,  Endo/Other  negative endocrine ROS    Renal/GU negative Renal ROS     Musculoskeletal   Abdominal  (+) + obese  Peds  Hematology negative hematology ROS (+)   Anesthesia Other Findings Last phentermine: patient no longer taking  Reproductive/Obstetrics                              Anesthesia Physical Anesthesia Plan  ASA: 2  Anesthesia Plan: MAC   Post-op Pain Management: Tylenol  PO (pre-op)*, Toradol  IV (intra-op)* and Minimal or no pain anticipated   Induction: Intravenous  PONV Risk Score and Plan: 2 and Ondansetron , Dexamethasone , Propofol  infusion, TIVA, Midazolam  and Treatment may vary due to age or medical condition  Airway Management Planned: Natural Airway and Simple Face Mask  Additional Equipment:   Intra-op Plan:   Post-operative Plan:   Informed Consent: I have reviewed the patients History and Physical, chart, labs and discussed the procedure including the risks, benefits and alternatives for the proposed anesthesia with the patient or authorized representative who has indicated his/her  understanding and acceptance.     Dental advisory given  Plan Discussed with: CRNA and Anesthesiologist  Anesthesia Plan Comments: (Discussed with patient risks of MAC including, but not limited to, minor pain or discomfort, hearing people in the room, and possible need for backup general anesthesia. Risks for general anesthesia also discussed including, but not limited to, sore throat, hoarse voice, chipped/damaged teeth, injury to vocal cords, nausea and vomiting, allergic reactions, lung infection, heart attack, stroke, and death. All questions answered. )         Anesthesia Quick Evaluation  "

## 2024-08-10 NOTE — Discharge Instructions (Addendum)
" °  Post Anesthesia Home Care Instructions  Activity: Get plenty of rest for the remainder of the day. A responsible individual must stay with you for 24 hours following the procedure.  For the next 24 hours, DO NOT: -Drive a car -Advertising copywriter -Drink alcoholic beverages -Take any medication unless instructed by your physician -Make any legal decisions or sign important papers.  Meals: Start with liquid foods such as gelatin or soup. Progress to regular foods as tolerated. Avoid greasy, spicy, heavy foods. If nausea and/or vomiting occur, drink only clear liquids until the nausea and/or vomiting subsides. Call your physician if vomiting continues.  Special Instructions/Symptoms: Your throat may feel dry or sore from the anesthesia or the breathing tube placed in your throat during surgery. If this causes discomfort, gargle with warm salt water. The discomfort should disappear within 24 hours.    You received Tylenol  1,000 mg at 11:25 pm. Do not take any Tylenol  until after 5:45 pm.       "

## 2024-08-10 NOTE — Anesthesia Postprocedure Evaluation (Signed)
"   Anesthesia Post Note  Patient: Krishana Richwine Kham  Procedure(s) Performed: CONE BIOPSY, CERVIX (Cervix) INSERTION, INTRAUTERINE DEVICE (Uterus) REMOVAL, INTRAUTERINE DEVICE (Uterus)     Patient location during evaluation: PACU Anesthesia Type: MAC Level of consciousness: awake and alert and oriented Pain management: pain level controlled Vital Signs Assessment: post-procedure vital signs reviewed and stable Respiratory status: spontaneous breathing, nonlabored ventilation and respiratory function stable Cardiovascular status: stable and blood pressure returned to baseline Postop Assessment: no apparent nausea or vomiting Anesthetic complications: no   No notable events documented.  Last Vitals:  Vitals:   08/10/24 1418 08/10/24 1430  BP: 111/60 119/63  Pulse: 72 69  Resp: 14 11  Temp: 36.7 C   SpO2: 96% 95%    Last Pain:  Vitals:   08/10/24 1114  TempSrc: Oral  PainSc: 0-No pain   Pain Goal: Patients Stated Pain Goal: 7 (08/10/24 1114)                 Alaine Loughney A.      "

## 2024-08-10 NOTE — Transfer of Care (Signed)
 Immediate Anesthesia Transfer of Care Note  Patient: Lacey Baker  Procedure(s) Performed: CONE BIOPSY, CERVIX (Cervix) INSERTION, INTRAUTERINE DEVICE (Uterus) REMOVAL, INTRAUTERINE DEVICE (Uterus)  Patient Location: PACU  Anesthesia Type:General  Level of Consciousness: awake, alert , and oriented  Airway & Oxygen Therapy: Patient Spontanous Breathing and Patient connected to face mask oxygen  Post-op Assessment: Report given to RN, Post -op Vital signs reviewed and stable, and Patient moving all extremities X 4  Post vital signs: Reviewed and stable  Last Vitals:  Vitals Value Taken Time  BP 111/60 08/10/24 14:18  Temp 36.7 C 08/10/24 14:18  Pulse 71 08/10/24 14:22  Resp 13 08/10/24 14:22  SpO2 95 % 08/10/24 14:22  Vitals shown include unfiled device data.  Last Pain:  Vitals:   08/10/24 1114  TempSrc: Oral  PainSc: 0-No pain      Patients Stated Pain Goal: 7 (08/10/24 1114)  Complications: No notable events documented.

## 2024-08-10 NOTE — Interval H&P Note (Signed)
 History and Physical Interval Note:  08/10/2024 12:33 PM  Lacey Baker  has presented today for surgery, with the diagnosis of CIN II.  The various methods of treatment have been discussed with the patient and family. After consideration of risks, benefits and other options for treatment, the patient has consented to  Procedures with comments: CONE BIOPSY, CERVIX (N/A) - ECC before and after surgery INSERTION, INTRAUTERINE DEVICE (N/A) - Mirena  IUD REMOVAL, INTRAUTERINE DEVICE (N/A) as a surgical intervention.  The patient's history has been reviewed, patient examined, no change in status, stable for surgery.  I have reviewed the patient's chart and labs.  Questions were answered to the patient's satisfaction.     Almarie MARLA Carpen

## 2024-08-10 NOTE — Discharge Instr - Supplementary Instructions (Signed)
 May take Tylenol  after 5:30pm if needed for discomfort.

## 2024-08-10 NOTE — Anesthesia Procedure Notes (Signed)
 Procedure Name: MAC Date/Time: 08/10/2024 1:15 PM  Performed by: Jolynn Mage, CRNAPre-anesthesia Checklist: Patient identified, Emergency Drugs available, Suction available, Timeout performed and Patient being monitored Patient Re-evaluated:Patient Re-evaluated prior to induction Oxygen Delivery Method: Simple face mask

## 2024-08-11 ENCOUNTER — Encounter (HOSPITAL_COMMUNITY): Payer: Self-pay | Admitting: Obstetrics and Gynecology

## 2024-08-13 ENCOUNTER — Ambulatory Visit: Payer: Self-pay | Admitting: Obstetrics and Gynecology

## 2024-08-13 ENCOUNTER — Other Ambulatory Visit: Payer: Self-pay | Admitting: Obstetrics and Gynecology

## 2024-08-13 DIAGNOSIS — D508 Other iron deficiency anemias: Secondary | ICD-10-CM

## 2024-08-13 DIAGNOSIS — Z975 Presence of (intrauterine) contraceptive device: Secondary | ICD-10-CM

## 2024-08-13 LAB — SURGICAL PATHOLOGY

## 2024-08-13 NOTE — Op Note (Signed)
 08/13/2024   [id number]  Lacey Baker    OPERATIVE REPORT  PREOPERATIVE DIAGNOSIS:  abnormal pap smear CINII, Positive ECC CIN1 on colposcopy Mirena  iud in place  POSTOPERATIVE DIAGNOSIS:    Same    PROCEDURE:   1.  Cold knife conization of the cervix 2.  ECC before and after the procedure 3. Removal of mirena  IUD, placement of new mirena  IUD    SURGEON:  Almarie MARLA Carpen, MD  ASSISTANT:  None   IVF:   Please see anesthesia report  EBL:   minimal  COMPLICATIONS:  None.    The patient is a CINII with positive ECC   A plan was made to proceed with a cold knife conization of the cervix after risks, benefits, and alternatives were reviewed.     FINDINGS:    Lugols's solution demonstrated  large affected portion of the cervix Mirena  IUD removed with no complications and a new one placed with 8cm normal uterine cavity.  SPECIMEN:    The conization of the cervix was sent to pathology with a silk suture marking the 12:00 position.  The ECC sample before and after the procedure were collected and sent to pathology.    PROCEDURE:  The patient was re-identified in the preop hold area.  She received Ted hose and SCD's for DVT prophylaxis.    In the operating room, she was received an LMA anesthetic and then placed in dorsal lithotomy position in the [place] stirrups.    The patient was sterilely prepped and lugals solution was applied.  A proper time out was performed  and everyone agreed.  A bivalve speculum was used to visualize the cervix.  ECC was performed. The mirena  IUD was removed without difficulty and was intact. The cervix was stained with Lugol's solution.  Figure of 8 sutures of 0/0 Vicryl were placed at the 3 and 9 o'clock positions of the cervix to ligate inferior descending branches of the uterine artery bilaterally. The cervix was injected circumferentially with 1% lidocaine  with epinephrine  1:100,000.  The scalpel was used to perform the  conization without difficulty. Mayo scissors were used to keep the angle in and remove the a large cone biopsy. A repeat ECC was performed.  The specimen was marked at 12:00 with a silk suture and sent to pathology.   This specimen was all sent to pathology.  The monopolar cautery was used to create hemostasis in the endocervical conization bed.  Monsel's was later placed, and hemostasis was then good.  The mirena  iud was sound and placed at the level of the fundus and the strings were trimmed about Surgical was placed at the bed and the 2 anchor sutures were tied together for hemostasis in the post op time frame.  Good hemostasis was assured.  All vaginal instruments were removed.   This concluded the patient's procedure.  There were no complications.  All needle, instrument, and sponge counts were correct.   The patient was escorted to the recovery room in stable and awake condition.   Almarie MARLA Carpen

## 2024-08-16 ENCOUNTER — Telehealth: Payer: Self-pay | Admitting: *Deleted

## 2024-08-16 NOTE — Telephone Encounter (Signed)
-----   Message from Almarie Carpen, MD sent at 08/13/2024 12:20 PM EST ----- Please schedule PUS in 2-3 months for anemia. IUD in place. Needs to heal from CKC Dr. Carpen

## 2024-08-16 NOTE — Telephone Encounter (Signed)
 Spoke with patient, advised per Dr. Glennon.  Patient states she will plan to further discuss during post-op visit on 08/25/24. Will schedule prior to leaving office if necessary to proceed. Questions answered. Patient reports she is doing well post-op.   Routing to provider for final review. Patient is agreeable to disposition. Will close encounter.

## 2024-08-25 ENCOUNTER — Encounter: Payer: Self-pay | Admitting: Obstetrics and Gynecology

## 2024-08-25 ENCOUNTER — Ambulatory Visit: Admitting: Obstetrics and Gynecology

## 2024-08-25 VITALS — BP 124/72 | HR 79 | Ht 64.0 in | Wt 196.8 lb

## 2024-08-25 DIAGNOSIS — Z975 Presence of (intrauterine) contraceptive device: Secondary | ICD-10-CM

## 2024-08-25 DIAGNOSIS — D508 Other iron deficiency anemias: Secondary | ICD-10-CM

## 2024-08-25 DIAGNOSIS — N871 Moderate cervical dysplasia: Secondary | ICD-10-CM

## 2024-08-25 DIAGNOSIS — G4730 Sleep apnea, unspecified: Secondary | ICD-10-CM

## 2024-08-25 DIAGNOSIS — Z09 Encounter for follow-up examination after completed treatment for conditions other than malignant neoplasm: Secondary | ICD-10-CM

## 2024-08-25 NOTE — Progress Notes (Signed)
" ° °  Acute Office Visit  Subjective:    Patient ID: Lacey Baker, female    DOB: [birth year], 59 y.o.   MRN: [id number]   HPI 59 y.o. presents today for Post-op Follow-up (2 wks) .post op from CKC with mirena  IUD removal and placement of new mirena  IUD ECC before and after procedure Doing well Decreased bleeding No fevers, dysuria Does report a history of snoring at night and would like a sleep study towards the end of the year Also considering compound GLP-1 through a medical program  No LMP recorded. (Menstrual status: IUD).    Review of Systems     Objective:    OBGyn Exam  BP 124/72 (BP Location: Left Arm, Patient Position: Sitting, Cuff Size: Normal)   Pulse 79   Ht 5' 4 (1.626 m)   Wt 196 lb 12.8 oz (89.3 kg)   SpO2 99%   BMI 33.78 kg/m  Wt Readings from Last 3 Encounters:  08/25/24 196 lb 12.8 oz (89.3 kg)  08/10/24 192 lb (87.1 kg)  07/22/24 193 lb 9.6 oz (87.8 kg)          Assessment & Plan:  2wk postop from CKC Expressed concern with positive residual abnormal cells and positive ECC after the cone biopsy. Discussed large portion of cervix removed and I don't feel as if there would be much cervix left if repeating the CKC. Reviewed RLH with BSO as an option as well. She would like to have this done this coming summer and we discussed not repeating the pap smear but moving forward with the [facility] instead and she agreed. The procedure and after care was discussed. Encouraged patient to begin either vaginal estrogen or intrarosa. She will try intrarosa. Prior visit I did not get the vaginal estrogen in, but would like to try the intrarosa. She will check with [facility] and see if they can offer a better price than [facility]. Sleep study placed for the end of the year with filling her deductible Encouraged patient to do gardesil, even if 2 shots. She will consider. Counseled on glp-1 options and to continue diet and exercise. Hg low despite amenorrhea on  the mirena . To repeat labs and then GI work-up if still low. Standing order placed for these labs, since the lab is at lunch  Dr. Glennon Almarie MARLA Glennon "

## 2024-09-06 ENCOUNTER — Ambulatory Visit (HOSPITAL_BASED_OUTPATIENT_CLINIC_OR_DEPARTMENT_OTHER)
Admission: RE | Admit: 2024-09-06 | Discharge: 2024-09-06 | Disposition: A | Discharge status: Home / Self Care | Payer: Self-pay | Source: Ambulatory Visit | Attending: Obstetrics and Gynecology | Admitting: Obstetrics and Gynecology

## 2024-09-06 DIAGNOSIS — Z9189 Other specified personal risk factors, not elsewhere classified: Secondary | ICD-10-CM | POA: Insufficient documentation

## 2024-09-06 DIAGNOSIS — E7800 Pure hypercholesterolemia, unspecified: Secondary | ICD-10-CM | POA: Insufficient documentation

## 2024-09-06 DIAGNOSIS — Z01818 Encounter for other preprocedural examination: Secondary | ICD-10-CM | POA: Insufficient documentation

## 2024-09-07 ENCOUNTER — Ambulatory Visit: Payer: Self-pay | Admitting: Obstetrics and Gynecology

## 2025-03-10 ENCOUNTER — Ambulatory Visit: Admitting: Obstetrics and Gynecology
